# Patient Record
Sex: Female | Born: 1980 | Race: White | Hispanic: No | Marital: Single | Smoking: Former smoker
Health system: Southern US, Community
[De-identification: ages and names within clinical notes are randomized; demographics above are authoritative.]

## PROBLEM LIST (undated history)

## (undated) DIAGNOSIS — M1990 Unspecified osteoarthritis, unspecified site: Secondary | ICD-10-CM

## (undated) HISTORY — PX: LIPOSUCTION: SHX10

## (undated) HISTORY — PX: TONSILLECTOMY: SUR1361

## (undated) HISTORY — PX: RHINOPLASTY: SUR1284

## (undated) HISTORY — PX: BREAST SURGERY: SHX581

---

## 2014-02-01 NOTE — L&D Delivery Note (Signed)
Patient was C/C/+1 and pushed for 2hr 8 minutes with epidural.   NSVD female infant, Apgars 9/9, weight pending.   The patient had 2nd laceration repaired with 2-0vircyl and a periurethral tear repaired with 3-0 vicryl. Fundus was firm. EBL was expected amount. Placenta was delivered intact. Vagina was clear.  Baby was vigorous and doing skin to skin with mother.  Brittney Ramos

## 2014-04-01 LAB — OB RESULTS CONSOLE RPR: RPR: NONREACTIVE

## 2014-04-01 LAB — OB RESULTS CONSOLE GC/CHLAMYDIA
CHLAMYDIA, DNA PROBE: NEGATIVE
Gonorrhea: NEGATIVE

## 2014-04-01 LAB — OB RESULTS CONSOLE ABO/RH: RH Type: POSITIVE

## 2014-04-01 LAB — OB RESULTS CONSOLE RUBELLA ANTIBODY, IGM: Rubella: IMMUNE

## 2014-04-01 LAB — OB RESULTS CONSOLE HEPATITIS B SURFACE ANTIGEN: Hepatitis B Surface Ag: NEGATIVE

## 2014-04-01 LAB — OB RESULTS CONSOLE ANTIBODY SCREEN: Antibody Screen: NEGATIVE

## 2014-04-01 LAB — OB RESULTS CONSOLE HIV ANTIBODY (ROUTINE TESTING): HIV: NONREACTIVE

## 2014-04-08 ENCOUNTER — Other Ambulatory Visit (HOSPITAL_COMMUNITY): Payer: Self-pay | Admitting: Obstetrics

## 2014-04-08 DIAGNOSIS — O289 Unspecified abnormal findings on antenatal screening of mother: Secondary | ICD-10-CM

## 2014-05-03 ENCOUNTER — Encounter (HOSPITAL_COMMUNITY): Payer: Self-pay

## 2014-05-03 ENCOUNTER — Ambulatory Visit (HOSPITAL_COMMUNITY)
Admission: RE | Admit: 2014-05-03 | Discharge: 2014-05-03 | Disposition: A | Discharge status: Home / Self Care | Payer: Medicaid Other | Source: Ambulatory Visit | Attending: Obstetrics | Admitting: Obstetrics

## 2014-05-03 DIAGNOSIS — O289 Unspecified abnormal findings on antenatal screening of mother: Secondary | ICD-10-CM | POA: Insufficient documentation

## 2014-05-03 DIAGNOSIS — Z3689 Encounter for other specified antenatal screening: Secondary | ICD-10-CM | POA: Insufficient documentation

## 2014-05-03 DIAGNOSIS — O09899 Supervision of other high risk pregnancies, unspecified trimester: Secondary | ICD-10-CM

## 2014-05-03 DIAGNOSIS — Z3A19 19 weeks gestation of pregnancy: Secondary | ICD-10-CM | POA: Diagnosis not present

## 2014-05-03 DIAGNOSIS — Z36 Encounter for antenatal screening of mother: Secondary | ICD-10-CM | POA: Insufficient documentation

## 2014-05-03 DIAGNOSIS — O280 Abnormal hematological finding on antenatal screening of mother: Principal | ICD-10-CM

## 2014-05-03 DIAGNOSIS — O283 Abnormal ultrasonic finding on antenatal screening of mother: Secondary | ICD-10-CM | POA: Diagnosis not present

## 2014-05-03 HISTORY — DX: Unspecified osteoarthritis, unspecified site: M19.90

## 2014-05-03 IMAGING — US US OB DETAIL+14 WK
1 series · 12 of 28 positions shown · non-contrast
Comparison: none

[Series 1: us ob detail+14 wk · 0.20mm/px · 12 of 94 slices shown]
[im 4/94]
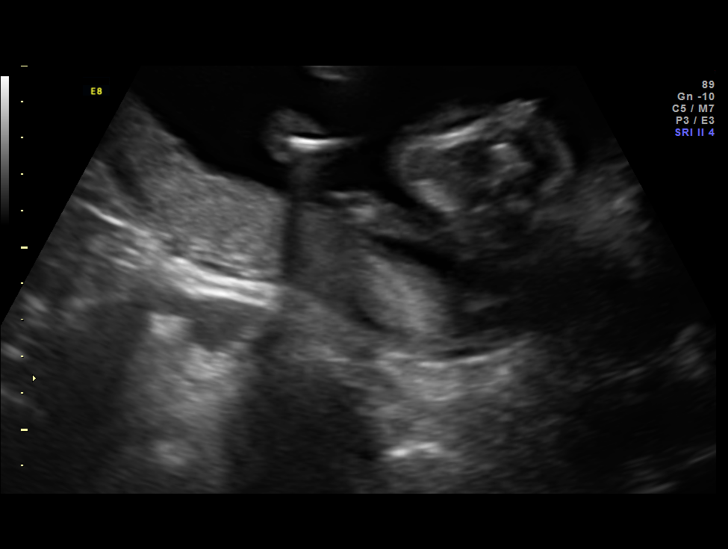
[im 11/94]
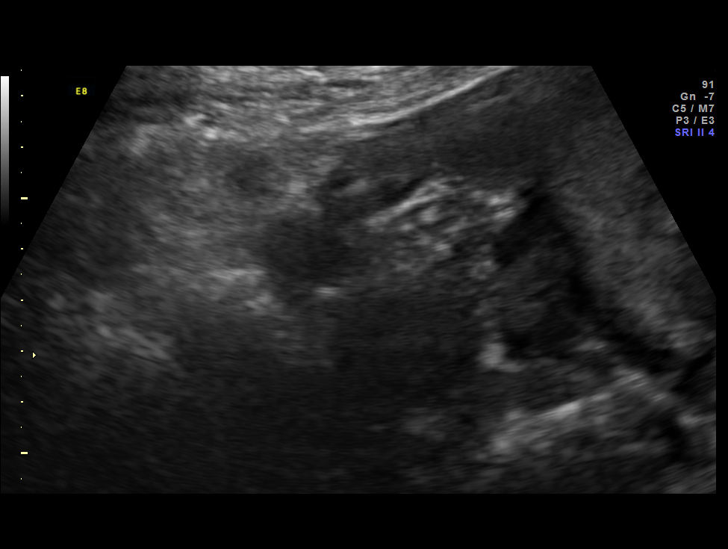
[im 18/94]
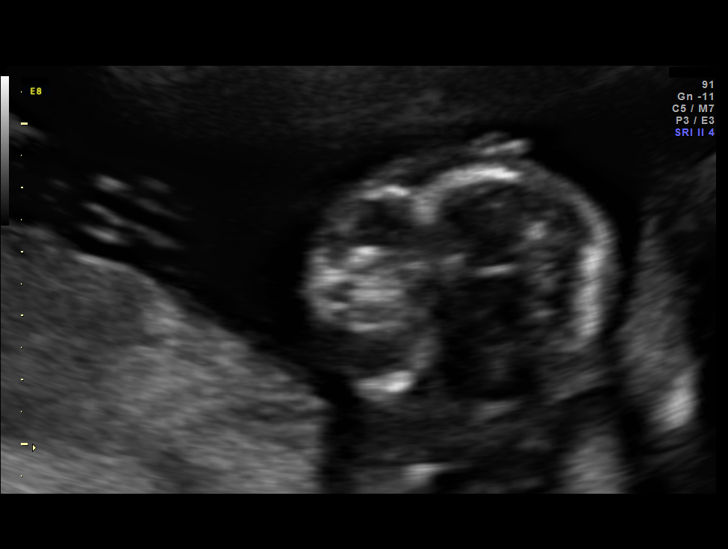
[im 28/94]
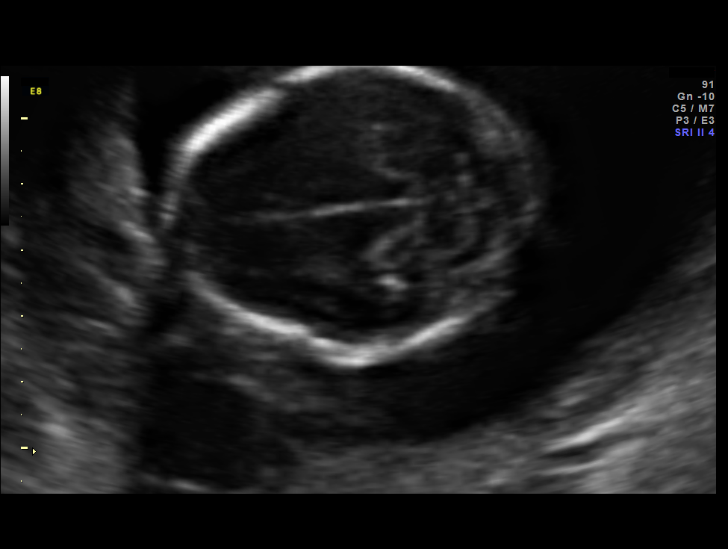
[im 35/94]
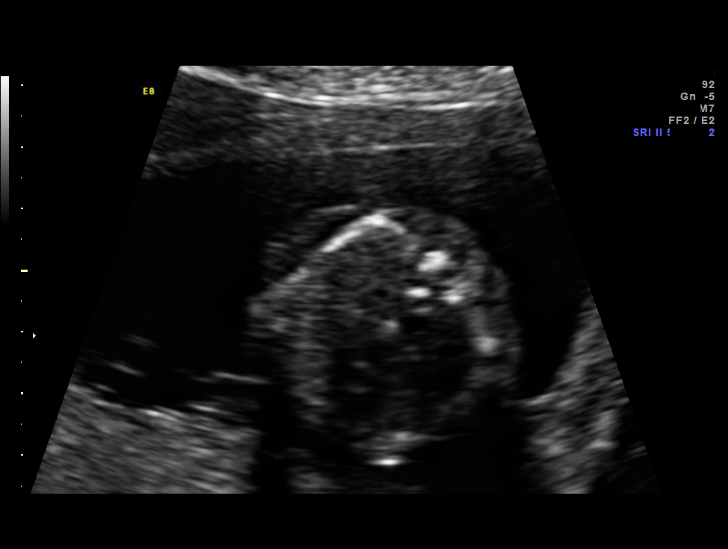
[im 42/94]
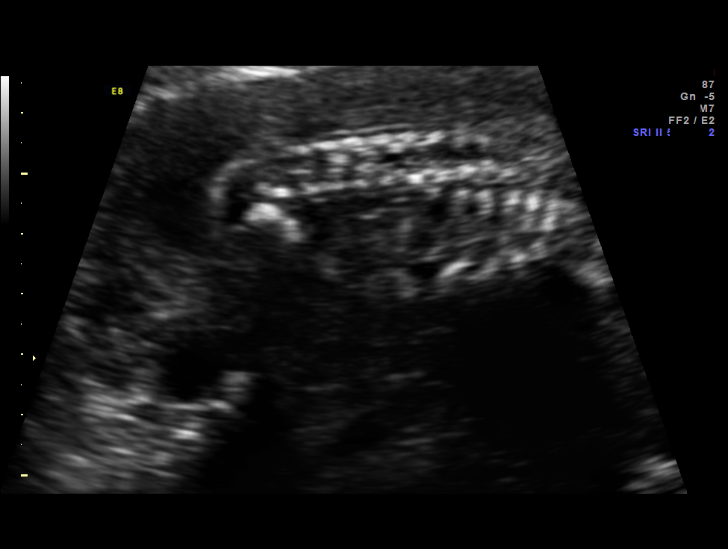
[im 52/94]
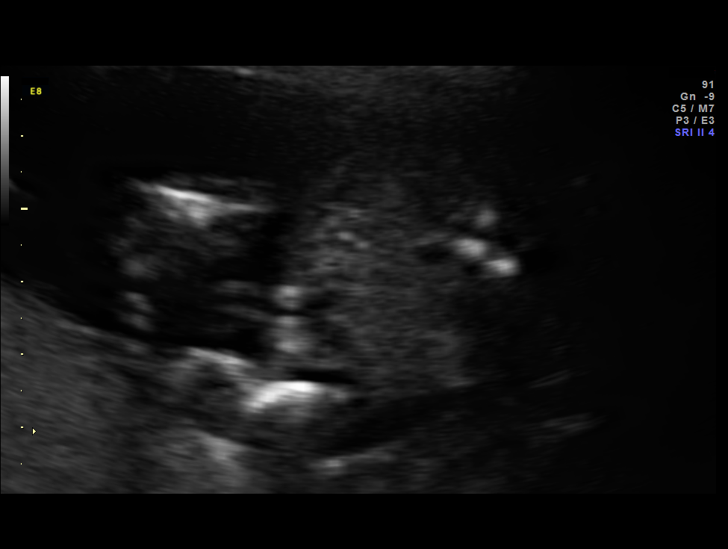
[im 59/94]
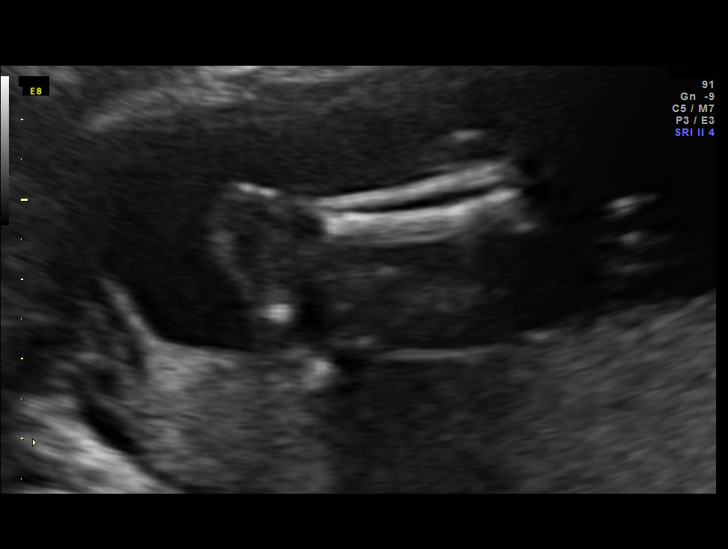
[im 66/94]
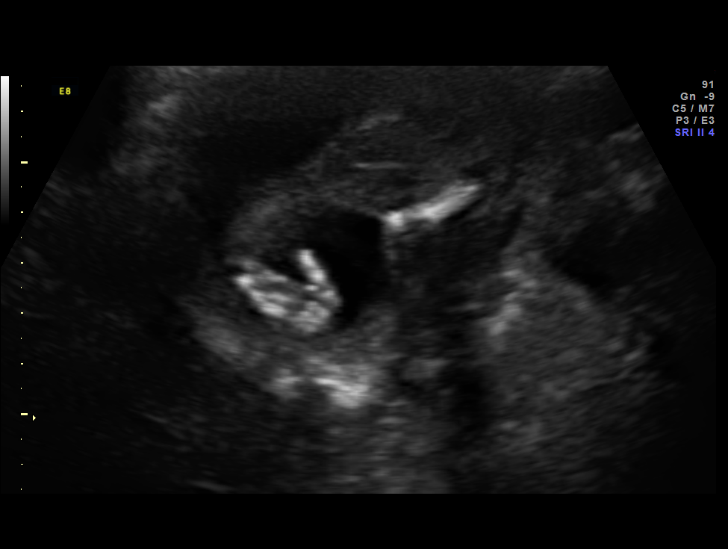
[im 76/94]
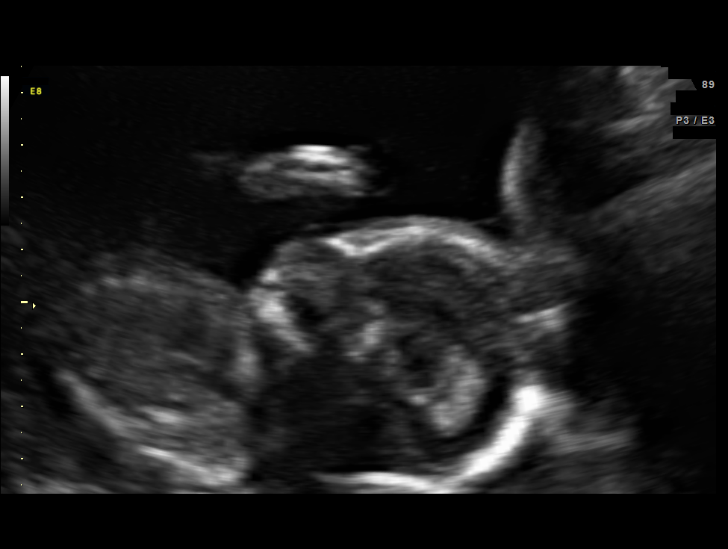
[im 83/94]
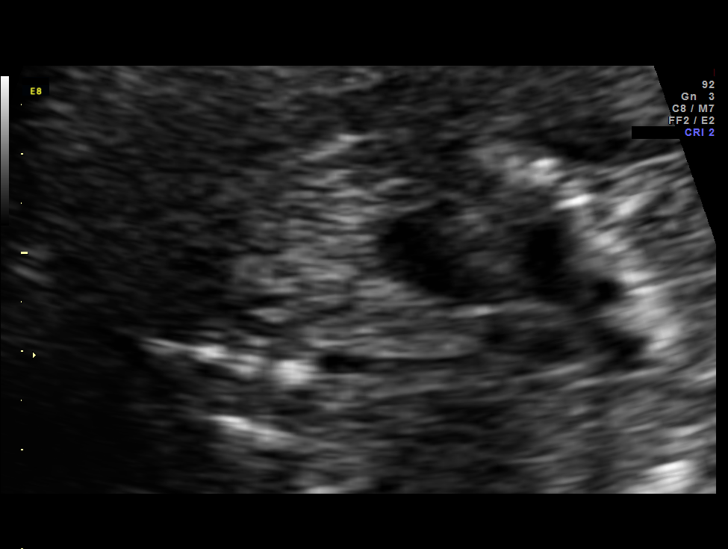
[im 90/94]
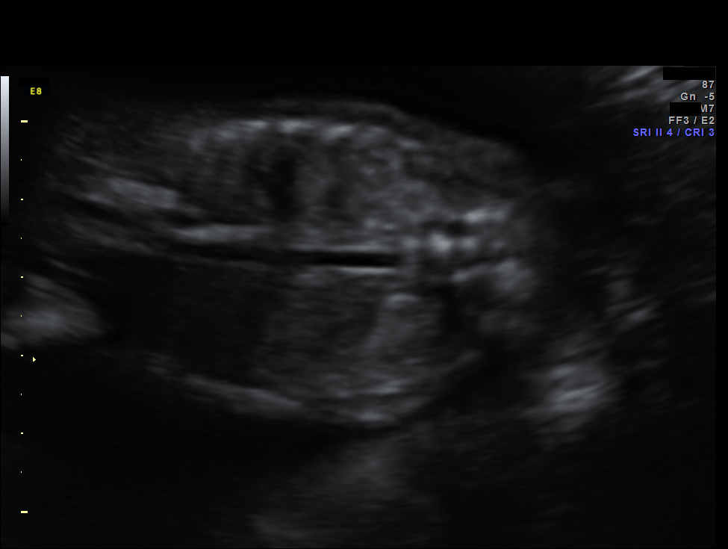

[12 of 28 positions shown; findings below may reference images not displayed]

OBSTETRICS REPORT
                      (Signed Final 05/03/2014 [DATE])

Service(s) Provided

 US OB DETAIL + 14 WK                                  76811.0
Indications

 Abnormal biochemical screen: Domingo Braswell
 19 weeks gestation of pregnancy
 Detailed fetal anatomic survey                        Z36
Fetal Evaluation

 Num Of Fetuses:    1
 Fetal Heart Rate:  144                          bpm
 Cardiac Activity:  Observed
 Presentation:      Cephalic
 Placenta:          Posterior, low-lying,
                    cm from int os
 P. Cord            Visualized
 Insertion:

 Amniotic Fluid
 AFI FV:      Subjectively within normal limits
                                             Larg Pckt:     5.2  cm
Biometry

 BPD:     42.8  mm     G. Age:  19w 0d                CI:         78.1   70 - 86
 OFD:     54.8  mm                                    FL/HC:      18.9   16.1 -

 HC:     157.3  mm     G. Age:  18w 5d       16  %    HC/AC:      1.10   1.09 -

 AC:     143.3  mm     G. Age:  19w 4d       58  %    FL/BPD:
 FL:      29.7  mm     G. Age:  19w 1d       39  %    FL/AC:      20.7   20 - 24
 HUM:     26.6  mm     G. Age:  18w 3d       28  %
 CER:     18.4  mm     G. Age:  18w 1d       19  %
 NFT:      4.4  mm
 Est. FW:     287  gm    0 lb 10 oz      47  %
Gestational Age

 U/S Today:     19w 1d                                        EDD:   09/26/14
 Best:          19w 2d     Det. By:  Early Ultrasound         EDD:   09/25/14
                                     (03/20/14)
Anatomy



 Other:  Fetus appears to be a female. Heels appears normal. LT 5th digit
         appears normal.
Targeted Anatomy

 Fetal Central Nervous System
 Cisterna Magna:
Cervix Uterus Adnexa

 Cervical Length:    3.6      cm

 Cervix:       Normal appearance by transabdominal scan. Appears
               closed, without funnelling.
 Left Ovary:    Within normal limits.
 Right Ovary:   Within normal limits.
Impression

 SIUP at 19+2 weeks
 Normal detailed fetal anatomy
 Markers of aneuploidy: none
 Normal amniotic fluid volume
 Measurements consistent with early US
 Posterior low-lying placenta
Recommendations

 Follow-up ultrasound for growth and placental location in the
 early third trimester
 Please see genetic counseling note

## 2014-05-06 ENCOUNTER — Other Ambulatory Visit (HOSPITAL_COMMUNITY): Payer: Self-pay | Admitting: Obstetrics

## 2014-05-06 ENCOUNTER — Encounter (HOSPITAL_COMMUNITY): Payer: Self-pay | Admitting: Obstetrics

## 2014-05-06 DIAGNOSIS — O09899 Supervision of other high risk pregnancies, unspecified trimester: Secondary | ICD-10-CM | POA: Insufficient documentation

## 2014-05-06 DIAGNOSIS — O280 Abnormal hematological finding on antenatal screening of mother: Principal | ICD-10-CM

## 2014-05-06 NOTE — Progress Notes (Signed)
Genetic Counseling  High-Risk Gestation Note  Appointment Date:  05/03/2014 Referred By: Marlow Baars, MD Date of Birth:  01-09-1981 Partner:  Brittney Ramos   Pregnancy History: G1P0 Estimated Date of Delivery: 09/25/14 Estimated Gestational Age: [redacted]w[redacted]d Attending: Particia Nearing, MD    [name] and her partner, Mr. Brittney Ramos, were seen for genetic counseling because of a low PAPP-A measurement on First Trimester Screening through [facility].   [name] had the first part of Sequential Screening (which is similar to the First trimester screen) performed through her OB office. We discussed that screening tests were designed to modify a patient's a priori risk for certain conditions, in this case, Down syndrome and trisomy 18. This estimate provides a pregnancy specific risk assessment. We reviewed that the results of Part 1 of the Sequential Screen for Brittney Ramos were considered within normal range for Down syndrome, reducing the risk from 1 in 355 to 1 in 440, and for Trisomy 18, increasing the risk from 1 in 1382 to 1 in 270 but still in the screen negative range. Brittney Ramos reported that part 2 of the sequential screen was drawn in early March. We discussed that this second blood draw further refines the risk for Down syndrome, Trisomy 18, and provides a risk assessment for open neural tube defects. These results were not available to [place] at the time of today's visit.   Even though the risk assessment provided by this screen was considered within normal range, the level of one of the proteins analyzed, PAPP-A, was very low (0.31 MoM). This has been associated with an increased risk for growth restriction or poor pregnancy outcome later in pregnancy; therefore, we would recommend a follow up ultrasound for fetal growth in the third trimester and monitoring the patient for signs of preeclampsia. We reviewed that available data do not indicate the specific risk for adverse pregnancy  outcomes when a low PAPP-A is detected in the first trimester. We reviewed that this level can also be low because of differences in maternal metabolism and normal variation.   A complete ultrasound was performed today. The ultrasound report will be sent under separate cover. There were no visualized fetal anomalies or markers suggestive of aneuploidy. They understand that screening tests cannot rule out all birth defects or genetic syndromes.   Brittney Ramos was provided with written information regarding cystic fibrosis (CF) including the carrier frequency and incidence in the Caucasian and Asian populations, the availability of carrier testing and prenatal diagnosis if indicated.  In addition, we discussed that CF is routinely screened for as part of the [facility] newborn screening panel.  She declined CF testing today.   Both family histories were reviewed and found to be contributory for a condition for the patient and her mother that causes them to develop "lumps and bumps" particularly in their glands. Brittney Ramos reported that her mother has had multiple surgeries to remove these benign growths, including a hysterectomy. 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 We reviewed genes and chromosomes. In autosomal dominant inheritance,  having one nonworking or changed copy in a particular gene pair causes the particular condition. Each offspring of an individual with an autosomal dominant condition has a 1 in 2 (50%) chance to also inherit the condition.  Males and females have  equal chances to inherit the condition. We discussed that often in autosomal dominant conditions, there can be variable expressivity of the features.  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 We discussed that in the case of an autosomal dominant condition, recurrence risk for the current pregnancy would be 1 in 2 (50%). Additional information regarding this history may alter recurrence risk assessment.  Without further information regarding the provided family history, an accurate genetic risk cannot be calculated. Further genetic counseling is warranted if more information is obtained.   Brittney Ramos denied exposure to environmental toxins or chemical agents. She denied the use of tobacco or street drugs. She denied significant viral illnesses during the course of her pregnancy. She reported drinking "a few" alcoholic drinks on January 1 and no alcohol other than that time during the pregnancy.  Prenatal alcohol exposure can increase the risk for growth delays, small head size, heart defects, eye and facial differences, as well as behavior problems and learning disabilities. The risk of these to occur tends to increase with the amount of alcohol consumed. However, because there is no identified safe amount of alcohol in pregnancy, it is recommended to completely avoid alcohol in pregnancy. Given the reported amount of exposure, risk for associated effects are likely low in the current pregnancy. Her medical and surgical histories were otherwise noncontributory from what was previously discussed during the family history discussion.   I counseled this couple for approximately 30 minutes regarding the above risks and available options.   Quinn PlowmanKaren Primrose Oler, MS,  Certified Genetic Counselor  05/06/2014

## 2014-08-30 LAB — OB RESULTS CONSOLE GBS: STREP GROUP B AG: NEGATIVE

## 2014-09-21 ENCOUNTER — Encounter (HOSPITAL_COMMUNITY): Payer: Self-pay

## 2014-09-21 ENCOUNTER — Inpatient Hospital Stay (HOSPITAL_COMMUNITY)
Admission: AD | Admit: 2014-09-21 | Discharge: 2014-09-21 | Disposition: A | Discharge status: Home / Self Care | Payer: Medicaid Other | Source: Ambulatory Visit | Attending: Obstetrics and Gynecology | Admitting: Obstetrics and Gynecology

## 2014-09-21 DIAGNOSIS — Z3A39 39 weeks gestation of pregnancy: Secondary | ICD-10-CM | POA: Diagnosis not present

## 2014-09-21 DIAGNOSIS — R51 Headache: Secondary | ICD-10-CM | POA: Insufficient documentation

## 2014-09-21 DIAGNOSIS — R110 Nausea: Secondary | ICD-10-CM | POA: Diagnosis not present

## 2014-09-21 DIAGNOSIS — Z87891 Personal history of nicotine dependence: Secondary | ICD-10-CM | POA: Diagnosis not present

## 2014-09-21 DIAGNOSIS — R519 Headache, unspecified: Secondary | ICD-10-CM

## 2014-09-21 DIAGNOSIS — O9989 Other specified diseases and conditions complicating pregnancy, childbirth and the puerperium: Secondary | ICD-10-CM | POA: Diagnosis not present

## 2014-09-21 DIAGNOSIS — O26893 Other specified pregnancy related conditions, third trimester: Secondary | ICD-10-CM

## 2014-09-21 LAB — URINALYSIS, ROUTINE W REFLEX MICROSCOPIC
BILIRUBIN URINE: NEGATIVE
Glucose, UA: NEGATIVE mg/dL
Hgb urine dipstick: NEGATIVE
Ketones, ur: NEGATIVE mg/dL
Nitrite: NEGATIVE
PH: 5.5 (ref 5.0–8.0)
Protein, ur: NEGATIVE mg/dL
UROBILINOGEN UA: 0.2 mg/dL (ref 0.0–1.0)

## 2014-09-21 LAB — CBC
HCT: 37.9 % (ref 36.0–46.0)
Hemoglobin: 12.6 g/dL (ref 12.0–15.0)
MCH: 27.9 pg (ref 26.0–34.0)
MCHC: 33.2 g/dL (ref 30.0–36.0)
MCV: 83.8 fL (ref 78.0–100.0)
Platelets: 297 10*3/uL (ref 150–400)
RBC: 4.52 MIL/uL (ref 3.87–5.11)
RDW: 14.5 % (ref 11.5–15.5)
WBC: 14.4 10*3/uL — ABNORMAL HIGH (ref 4.0–10.5)

## 2014-09-21 LAB — LACTATE DEHYDROGENASE: LDH: 100 U/L (ref 98–192)

## 2014-09-21 LAB — COMPREHENSIVE METABOLIC PANEL
ALT: 18 U/L (ref 14–54)
AST: 20 U/L (ref 15–41)
Albumin: 3 g/dL — ABNORMAL LOW (ref 3.5–5.0)
Alkaline Phosphatase: 106 U/L (ref 38–126)
Anion gap: 11 (ref 5–15)
BUN: 12 mg/dL (ref 6–20)
CO2: 21 mmol/L — ABNORMAL LOW (ref 22–32)
Calcium: 9.2 mg/dL (ref 8.9–10.3)
Chloride: 103 mmol/L (ref 101–111)
Creatinine, Ser: 0.44 mg/dL (ref 0.44–1.00)
GFR calc Af Amer: >60 mL/min (ref >60)
GFR calc non Af Amer: >60 mL/min (ref >60)
Glucose, Bld: 80 mg/dL (ref 65–99)
Potassium: 3.8 mmol/L (ref 3.5–5.1)
Sodium: 135 mmol/L (ref 135–145)
Total Bilirubin: 0.5 mg/dL (ref 0.3–1.2)
Total Protein: 6.8 g/dL (ref 6.5–8.1)

## 2014-09-21 LAB — PROTEIN / CREATININE RATIO, URINE
Creatinine, Urine: 12 mg/dL
Total Protein, Urine: <6 mg/dL

## 2014-09-21 LAB — URINE MICROSCOPIC-ADD ON

## 2014-09-21 LAB — URIC ACID: Uric Acid, Serum: 4.1 mg/dL (ref 2.3–6.6)

## 2014-09-21 NOTE — MAU Note (Signed)
Pt presents to MAU with complaints of nausea and a headache all day. Denies and vaginal bleeding, increase in vaginal discharge.

## 2014-09-21 NOTE — MAU Provider Note (Signed)
History     CSN: [id number]  Arrival date and time: 09/21/14 7829   First Provider Initiated Contact with Patient 09/21/14 1909      Chief Complaint  Patient presents with  . Headache   HPI  Brittney Ramos is a 34 y.o. G1P0 at [redacted]w[redacted]d. She presents with headache and nausea all day. She has taken tylenol x 3 doses, no help. She is well hydrated, was not out in the hot weather today. She does have some blurry vision, no hx headaches or migraines. The baby hasn't moved much this week, but has been active today. Is unsure if contracting, her abd has felt tight all day with low pressure/cramping. No bleeding or leaking.  B/P in PNR are 102-120/60-70. Today are 139/79, 139/81.  OB History    Gravida Para Term Preterm AB TAB SAB Ectopic Multiple Living   1               Past Medical History  Diagnosis Date  . Arthritis     Past Surgical History  Procedure Laterality Date  . Breast surgery      History reviewed. No pertinent family history.  Social History  Substance Use Topics  . Smoking status: Former Games developer  . Smokeless tobacco: None  . Alcohol Use: No    Allergies:  Allergies  Allergen Reactions  . Other Anaphylaxis    Cantelope, diet mountain dew, diet lipton tea  . Ibuprofen Nausea Only    Prescriptions prior to admission  Medication Sig Dispense Refill Last Dose  . Prenatal Vit-Fe Fumarate-FA (PRENATAL MULTIVITAMIN) TABS tablet Take 1 tablet by mouth daily at 12 noon.   Taking    Review of Systems  Constitutional: Positive for malaise/fatigue. Negative for fever and chills.  Gastrointestinal: Positive for nausea. Negative for vomiting, abdominal pain, diarrhea and constipation.  Genitourinary: Negative for dysuria, urgency and frequency.  Neurological: Negative for dizziness and focal weakness.   Physical Exam   Blood pressure 139/81, pulse 89, temperature 98.3 F (36.8 C), resp. rate 18, height 5\' 8"  (1.727 m), weight 117.935 kg (260 lb).  Physical  Exam  Nursing note and vitals reviewed. Constitutional: She is oriented to person, place, and time. She appears well-developed and well-nourished.  Musculoskeletal: Normal range of motion. She exhibits no edema.  Neurological: She is alert and oriented to person, place, and time. She has normal reflexes.  Skin: Skin is warm and dry.  Psychiatric: She has a normal mood and affect. Her behavior is normal.    MAU Course  Procedures  MDM Results for orders placed or performed during the hospital encounter of 09/21/14 (from the past 24 hour(s))  Urinalysis, Routine w reflex microscopic (not at [facility])     Status: Abnormal   Collection Time: 09/21/14  6:20 PM  Result Value Ref Range   Color, Urine YELLOW YELLOW   APPearance CLEAR CLEAR   Specific Gravity, Urine <1.005 (L) 1.005 - 1.030   pH 5.5 5.0 - 8.0   Glucose, UA NEGATIVE NEGATIVE mg/dL   Hgb urine dipstick NEGATIVE NEGATIVE   Bilirubin Urine NEGATIVE NEGATIVE   Ketones, ur NEGATIVE NEGATIVE mg/dL   Protein, ur NEGATIVE NEGATIVE mg/dL   Urobilinogen, UA 0.2 0.0 - 1.0 mg/dL   Nitrite NEGATIVE NEGATIVE   Leukocytes, UA SMALL (A) NEGATIVE  Urine microscopic-add on     Status: Abnormal   Collection Time: 09/21/14  6:20 PM  Result Value Ref Range   Squamous Epithelial / LPF FEW (A) RARE  WBC, UA 0-2 <3 WBC/hpf   RBC / HPF 0-2 <3 RBC/hpf   Bacteria, UA FEW (A) RARE  Protein / creatinine ratio, urine     Status: None   Collection Time: 09/21/14  6:20 PM  Result Value Ref Range   Creatinine, Urine 12.00 mg/dL   Total Protein, Urine <6 mg/dL   Protein Creatinine Ratio        0.00 - 0.15 mg/mg[Cre]  CBC     Status: Abnormal   Collection Time: 09/21/14  7:40 PM  Result Value Ref Range   WBC 14.4 (H) 4.0 - 10.5 K/uL   RBC 4.52 3.87 - 5.11 MIL/uL   Hemoglobin 12.6 12.0 - 15.0 g/dL   HCT 52.8 41.3 - 24.4 %   MCV 83.8 78.0 - 100.0 fL   MCH 27.9 26.0 - 34.0 pg   MCHC 33.2 30.0 - 36.0 g/dL   RDW 01.0 27.2 - 53.6 %   Platelets 297  150 - 400 K/uL  Comprehensive metabolic panel     Status: Abnormal   Collection Time: 09/21/14  7:40 PM  Result Value Ref Range   Sodium 135 135 - 145 mmol/L   Potassium 3.8 3.5 - 5.1 mmol/L   Chloride 103 101 - 111 mmol/L   CO2 21 (L) 22 - 32 mmol/L   Glucose, Bld 80 65 - 99 mg/dL   BUN 12 6 - 20 mg/dL   Creatinine, Ser 6.44 0.44 - 1.00 mg/dL   Calcium 9.2 8.9 - 03.4 mg/dL   Total Protein 6.8 6.5 - 8.1 g/dL   Albumin 3.0 (L) 3.5 - 5.0 g/dL   AST 20 15 - 41 U/L   ALT 18 14 - 54 U/L   Alkaline Phosphatase 106 38 - 126 U/L   Total Bilirubin 0.5 0.3 - 1.2 mg/dL   GFR calc non Af Amer >60 >60 mL/min   GFR calc Af Amer >60 >60 mL/min   Anion gap 11 5 - 15  Uric acid     Status: None   Collection Time: 09/21/14  7:40 PM  Result Value Ref Range   Uric Acid, Serum 4.1 2.3 - 6.6 mg/dL  Lactate dehydrogenase     Status: None   Collection Time: 09/21/14  7:40 PM  Result Value Ref Range   LDH 100 98 - 192 U/L      Assessment and Plan  39 3/7 wks with headaches, nausea PIH labs neg, B/P sl elevated for her but still within normal range Reactive strip Consulted with Dr Tenny Craw, pt may be discharged home, has an appt 8/22 in theoffice PIH precautions reviewed Labor precautions reviewed Pt declines tx for ha at this time, try warm bath, massage, cool compresses  Cayetano Mikita M. 09/21/2014, 7:19 PM

## 2014-09-23 ENCOUNTER — Institutional Professional Consult (permissible substitution): Payer: Self-pay | Admitting: Pediatrics

## 2014-09-25 ENCOUNTER — Telehealth (HOSPITAL_COMMUNITY): Payer: Self-pay | Admitting: *Deleted

## 2014-09-25 ENCOUNTER — Encounter (HOSPITAL_COMMUNITY): Payer: Self-pay | Admitting: *Deleted

## 2014-09-25 NOTE — Telephone Encounter (Signed)
Preadmission screen  

## 2014-10-02 ENCOUNTER — Inpatient Hospital Stay (HOSPITAL_COMMUNITY)
Admission: RE | Admit: 2014-10-02 | Discharge: 2014-10-05 | DRG: 775 | Disposition: A | Discharge status: Home / Self Care | Payer: Medicaid Other | Source: Ambulatory Visit | Attending: Obstetrics and Gynecology | Admitting: Obstetrics and Gynecology

## 2014-10-02 ENCOUNTER — Inpatient Hospital Stay (HOSPITAL_COMMUNITY): Payer: Medicaid Other | Admitting: Anesthesiology

## 2014-10-02 ENCOUNTER — Encounter (HOSPITAL_COMMUNITY): Payer: Self-pay | Admitting: General Practice

## 2014-10-02 DIAGNOSIS — O9962 Diseases of the digestive system complicating childbirth: Secondary | ICD-10-CM | POA: Diagnosis present

## 2014-10-02 DIAGNOSIS — Z6839 Body mass index (BMI) 39.0-39.9, adult: Secondary | ICD-10-CM

## 2014-10-02 DIAGNOSIS — Z3490 Encounter for supervision of normal pregnancy, unspecified, unspecified trimester: Secondary | ICD-10-CM

## 2014-10-02 DIAGNOSIS — O99214 Obesity complicating childbirth: Secondary | ICD-10-CM | POA: Diagnosis present

## 2014-10-02 DIAGNOSIS — Z3A41 41 weeks gestation of pregnancy: Secondary | ICD-10-CM | POA: Diagnosis present

## 2014-10-02 DIAGNOSIS — K219 Gastro-esophageal reflux disease without esophagitis: Secondary | ICD-10-CM | POA: Diagnosis present

## 2014-10-02 DIAGNOSIS — Z87891 Personal history of nicotine dependence: Secondary | ICD-10-CM | POA: Diagnosis not present

## 2014-10-02 DIAGNOSIS — O480 Post-term pregnancy: Secondary | ICD-10-CM | POA: Diagnosis present

## 2014-10-02 LAB — CBC
HCT: 35.4 % — ABNORMAL LOW (ref 36.0–46.0)
Hemoglobin: 11.9 g/dL — ABNORMAL LOW (ref 12.0–15.0)
MCH: 28 pg (ref 26.0–34.0)
MCHC: 33.6 g/dL (ref 30.0–36.0)
MCV: 83.3 fL (ref 78.0–100.0)
PLATELETS: 283 10*3/uL (ref 150–400)
RBC: 4.25 MIL/uL (ref 3.87–5.11)
RDW: 14 % (ref 11.5–15.5)
WBC: 15 10*3/uL — ABNORMAL HIGH (ref 4.0–10.5)

## 2014-10-02 LAB — RPR: RPR: NONREACTIVE

## 2014-10-02 LAB — TYPE AND SCREEN
ABO/RH(D): O POS
Antibody Screen: NEGATIVE

## 2014-10-02 LAB — ABO/RH: ABO/RH(D): O POS

## 2014-10-02 MED ORDER — MISOPROSTOL 25 MCG QUARTER TABLET
25.0000 ug | ORAL_TABLET | ORAL | Status: DC | PRN
  Administered 2014-10-02: 25 ug via VAGINAL
  Filled 2014-10-02: qty 0.25

## 2014-10-02 MED ORDER — CITRIC ACID-SODIUM CITRATE 334-500 MG/5ML PO SOLN: 30.0000 mL | ORAL | Status: DC | PRN

## 2014-10-02 MED ORDER — EPHEDRINE 5 MG/ML INJ: 10.0000 mg | INTRAVENOUS | Status: DC | PRN

## 2014-10-02 MED ORDER — OXYTOCIN 40 UNITS IN LACTATED RINGERS INFUSION - SIMPLE MED: 62.5000 mL/h | INTRAVENOUS | Status: DC

## 2014-10-02 MED ORDER — LIDOCAINE HCL (PF) 1 % IJ SOLN
30.0000 mL | INTRAMUSCULAR | Status: DC | PRN
  Administered 2014-10-03: 30 mL via SUBCUTANEOUS
  Filled 2014-10-02: qty 30

## 2014-10-02 MED ORDER — ONDANSETRON HCL 4 MG/2ML IJ SOLN: 4.0000 mg | Freq: Four times a day (QID) | INTRAMUSCULAR | Status: DC | PRN

## 2014-10-02 MED ORDER — LACTATED RINGERS IV SOLN: 500.0000 mL | INTRAVENOUS | Status: DC | PRN

## 2014-10-02 MED ORDER — LACTATED RINGERS IV SOLN
INTRAVENOUS | Status: DC
  Administered 2014-10-02 (×2): via INTRAVENOUS

## 2014-10-02 MED ORDER — FENTANYL 2.5 MCG/ML BUPIVACAINE 1/10 % EPIDURAL INFUSION (WH - ANES): 14.0000 mL/h | INTRAMUSCULAR | Status: DC | PRN

## 2014-10-02 MED ORDER — LIDOCAINE HCL (PF) 1 % IJ SOLN
INTRAMUSCULAR | Status: DC | PRN
  Administered 2014-10-02: 5 mL via EPIDURAL
  Administered 2014-10-02: 4 mL via EPIDURAL

## 2014-10-02 MED ORDER — OXYCODONE-ACETAMINOPHEN 5-325 MG PO TABS: 2.0000 | ORAL_TABLET | ORAL | Status: DC | PRN

## 2014-10-02 MED ORDER — OXYTOCIN BOLUS FROM INFUSION
500.0000 mL | INTRAVENOUS | Status: DC
  Administered 2014-10-03: 500 mL via INTRAVENOUS

## 2014-10-02 MED ORDER — OXYCODONE-ACETAMINOPHEN 5-325 MG PO TABS: 1.0000 | ORAL_TABLET | ORAL | Status: DC | PRN

## 2014-10-02 MED ORDER — OXYTOCIN 40 UNITS IN LACTATED RINGERS INFUSION - SIMPLE MED: 1.0000 m[IU]/min | INTRAVENOUS | Status: DC

## 2014-10-02 MED ORDER — PHENYLEPHRINE 40 MCG/ML (10ML) SYRINGE FOR IV PUSH (FOR BLOOD PRESSURE SUPPORT): 80.0000 ug | PREFILLED_SYRINGE | INTRAVENOUS | Status: DC | PRN

## 2014-10-02 MED ORDER — DIPHENHYDRAMINE HCL 50 MG/ML IJ SOLN: 12.5000 mg | INTRAMUSCULAR | Status: DC | PRN

## 2014-10-02 MED ORDER — ACETAMINOPHEN 325 MG PO TABS: 650.0000 mg | ORAL_TABLET | ORAL | Status: DC | PRN

## 2014-10-02 MED ORDER — PHENYLEPHRINE 40 MCG/ML (10ML) SYRINGE FOR IV PUSH (FOR BLOOD PRESSURE SUPPORT)
80.0000 ug | PREFILLED_SYRINGE | INTRAVENOUS | Status: DC | PRN
  Filled 2014-10-02: qty 20

## 2014-10-02 MED ORDER — FENTANYL 2.5 MCG/ML BUPIVACAINE 1/10 % EPIDURAL INFUSION (WH - ANES)
14.0000 mL/h | INTRAMUSCULAR | Status: DC | PRN
  Administered 2014-10-02: 15 mL/h via EPIDURAL
  Administered 2014-10-02 – 2014-10-03 (×2): 14 mL/h via EPIDURAL
  Filled 2014-10-02 (×2): qty 125

## 2014-10-02 MED ORDER — TERBUTALINE SULFATE 1 MG/ML IJ SOLN: 0.2500 mg | Freq: Once | INTRAMUSCULAR | Status: DC | PRN

## 2014-10-02 MED ORDER — ZOLPIDEM TARTRATE 5 MG PO TABS: 5.0000 mg | ORAL_TABLET | Freq: Every evening | ORAL | Status: DC | PRN

## 2014-10-02 MED ORDER — OXYTOCIN 40 UNITS IN LACTATED RINGERS INFUSION - SIMPLE MED
1.0000 m[IU]/min | INTRAVENOUS | Status: DC
  Administered 2014-10-02: 1 m[IU]/min via INTRAVENOUS
  Filled 2014-10-02: qty 1000

## 2014-10-02 NOTE — Anesthesia Procedure Notes (Signed)
Epidural Patient location during procedure: OB Start time: 10/02/2014 4:38 PM  Staffing Anesthesiologist: Mal Amabile Performed by: anesthesiologist   Preanesthetic Checklist Completed: patient identified, site marked, surgical consent, pre-op evaluation, timeout performed, IV checked, risks and benefits discussed and monitors and equipment checked  Epidural Patient position: sitting Prep: site prepped and draped and DuraPrep Patient monitoring: continuous pulse ox and blood pressure Approach: midline Location: L3-L4 Injection technique: LOR air  Needle:  Needle type: Tuohy  Needle gauge: 17 G Needle length: 9 cm and 9 Needle insertion depth: 8 cm Catheter type: closed end flexible Catheter size: 19 Gauge Catheter at skin depth: 12 cm Test dose: negative and Other  Assessment Events: blood not aspirated, injection not painful, no injection resistance, negative IV test and no paresthesia  Additional Notes Patient identified. Risks and benefits discussed including failed block, incomplete  Pain control, post dural puncture headache, nerve damage, paralysis, blood pressure Changes, nausea, vomiting, reactions to medications-both toxic and allergic and post Partum back pain. All questions were answered. Patient expressed understanding and wished to proceed. Sterile technique was used throughout procedure. Epidural site was Dressed with sterile barrier dressing. No paresthesias, signs of intravascular injection Or signs of intrathecal spread were encountered.  Patient was more comfortable after the epidural was dosed. Please see RN's note for documentation of vital signs and FHR which are stable.

## 2014-10-02 NOTE — Anesthesia Preprocedure Evaluation (Signed)
Anesthesia Evaluation  Patient identified by MRN, date of birth, ID band Patient awake    Reviewed: Allergy & Precautions, Patient's Chart, lab work & pertinent test results  Airway Mallampati: III  TM Distance: >3 FB Neck ROM: Full    Dental  (+) Teeth Intact   Pulmonary former smoker,  breath sounds clear to auscultation  Pulmonary exam normal       Cardiovascular negative cardio ROS Normal cardiovascular examRhythm:Regular Rate:Normal     Neuro/Psych negative neurological ROS  negative psych ROS   GI/Hepatic Neg liver ROS, GERD-  ,  Endo/Other  Morbid obesity  Renal/GU negative Renal ROS  negative genitourinary   Musculoskeletal  (+) Arthritis -,   Abdominal (+) + obese,   Peds  Hematology  (+) anemia ,   Anesthesia Other Findings   Reproductive/Obstetrics (+) Pregnancy                             Anesthesia Physical Anesthesia Plan  ASA: III  Anesthesia Plan: Epidural   Post-op Pain Management:    Induction:   Airway Management Planned: Natural Airway  Additional Equipment:   Intra-op Plan:   Post-operative Plan:   Informed Consent: I have reviewed the patients History and Physical, chart, labs and discussed the procedure including the risks, benefits and alternatives for the proposed anesthesia with the patient or authorized representative who has indicated his/her understanding and acceptance.     Plan Discussed with: Anesthesiologist  Anesthesia Plan Comments:         Anesthesia Quick Evaluation

## 2014-10-02 NOTE — Plan of Care (Signed)
Problem: Consults Goal: Birthing Suites Patient Information Press F2 to bring up selections list   Pt 37-[redacted] weeks EGA     

## 2014-10-02 NOTE — H&P (Signed)
34 y.o. [redacted]w[redacted]d  G1P0 comes in for scheduled IOL.  Otherwise has good fetal movement and no bleeding.  Past Medical History  Diagnosis Date  . Arthritis     Past Surgical History  Procedure Laterality Date  . Breast surgery    . Rhinoplasty    . Liposuction      OB History  Gravida Para Term Preterm AB SAB TAB Ectopic Multiple Living  1             # Outcome Date GA Lbr Len/2nd Weight Sex Delivery Anes PTL Lv  1 Current               Social History   Social History  . Marital Status: Single    Spouse Name: N/A  . Number of Children: N/A  . Years of Education: N/A   Occupational History  . Not on file.   Social History Main Topics  . Smoking status: Former Smoker    Quit date: 09/25/2011  . Smokeless tobacco: Never Used  . Alcohol Use: No  . Drug Use: No  . Sexual Activity: Not on file   Other Topics Concern  . Not on file   Social History Narrative   Grapefruit concentrate; Other; Ibuprofen; Robitussin (alcohol free); and Latex    Prenatal Transfer Tool  Maternal Diabetes: No Genetic Screening: Abnormal:  Results: Other: low PAPP-A Maternal Ultrasounds/Referrals: Normal Fetal Ultrasounds or other Referrals:  Referred to Materal Fetal Medicine  Maternal Substance Abuse:  No Significant Maternal Medications:  None Significant Maternal Lab Results: Lab values include: Group B Strep negative  Other PNC: uncomplicated.    Filed Vitals:   10/02/14 2224  BP:   Pulse:   Temp: 98.5 F (36.9 C)  Resp:      Lungs/Cor:  NAD Abdomen:  soft, gravid Ex:  no cords, erythema SVE:  1/th/-3 at admission FHTs:  130, good STV, NST R Toco:  q2-4   A/P   Admitted for IOL Received cytotec and ruptured shortly thereafter Switched to pitocin with subsequent cervical changed noted Now with epidural  GBS Neg  Joshaua Epple

## 2014-10-02 NOTE — Progress Notes (Signed)
Pt feeling more contractions. Pitocin  FHT Cat 1 TOCO: irreg SVE 3/70/-1 Increase pitocin 2x2 Pt may have epidural EFW 7-8# by leopolds

## 2014-10-03 ENCOUNTER — Encounter (HOSPITAL_COMMUNITY): Payer: Self-pay | Admitting: *Deleted

## 2014-10-03 MED ORDER — OXYCODONE-ACETAMINOPHEN 5-325 MG PO TABS
2.0000 | ORAL_TABLET | ORAL | Status: DC | PRN
  Administered 2014-10-04 – 2014-10-05 (×4): 2 via ORAL
  Filled 2014-10-03 (×4): qty 2

## 2014-10-03 MED ORDER — IBUPROFEN 600 MG PO TABS
600.0000 mg | ORAL_TABLET | Freq: Four times a day (QID) | ORAL | Status: DC
  Filled 2014-10-03: qty 1

## 2014-10-03 MED ORDER — ONDANSETRON HCL 4 MG/2ML IJ SOLN: 4.0000 mg | INTRAMUSCULAR | Status: DC | PRN

## 2014-10-03 MED ORDER — DIBUCAINE 1 % RE OINT
1.0000 "application " | TOPICAL_OINTMENT | RECTAL | Status: DC | PRN
  Administered 2014-10-04: 1 via RECTAL
  Filled 2014-10-03 (×2): qty 28

## 2014-10-03 MED ORDER — SENNOSIDES-DOCUSATE SODIUM 8.6-50 MG PO TABS
2.0000 | ORAL_TABLET | ORAL | Status: DC
  Administered 2014-10-04 – 2014-10-05 (×2): 2 via ORAL
  Filled 2014-10-03 (×2): qty 2

## 2014-10-03 MED ORDER — ONDANSETRON HCL 4 MG PO TABS: 4.0000 mg | ORAL_TABLET | ORAL | Status: DC | PRN

## 2014-10-03 MED ORDER — BENZOCAINE-MENTHOL 20-0.5 % EX AERO
1.0000 "application " | INHALATION_SPRAY | CUTANEOUS | Status: DC | PRN
  Administered 2014-10-03 – 2014-10-04 (×2): 1 via TOPICAL
  Filled 2014-10-03 (×3): qty 56

## 2014-10-03 MED ORDER — OXYCODONE-ACETAMINOPHEN 5-325 MG PO TABS
1.0000 | ORAL_TABLET | ORAL | Status: DC | PRN
  Administered 2014-10-03 (×2): 1 via ORAL
  Filled 2014-10-03 (×2): qty 1

## 2014-10-03 MED ORDER — PRENATAL MULTIVITAMIN CH
1.0000 | ORAL_TABLET | Freq: Every day | ORAL | Status: DC
  Administered 2014-10-03 – 2014-10-04 (×2): 1 via ORAL
  Filled 2014-10-03 (×3): qty 1

## 2014-10-03 MED ORDER — SIMETHICONE 80 MG PO CHEW: 80.0000 mg | CHEWABLE_TABLET | ORAL | Status: DC | PRN

## 2014-10-03 MED ORDER — LANOLIN HYDROUS EX OINT: TOPICAL_OINTMENT | CUTANEOUS | Status: DC | PRN

## 2014-10-03 MED ORDER — WITCH HAZEL-GLYCERIN EX PADS
1.0000 "application " | MEDICATED_PAD | CUTANEOUS | Status: DC | PRN
  Administered 2014-10-04: 1 via TOPICAL

## 2014-10-03 MED ORDER — TETANUS-DIPHTH-ACELL PERTUSSIS 5-2.5-18.5 LF-MCG/0.5 IM SUSP: 0.5000 mL | Freq: Once | INTRAMUSCULAR | Status: DC

## 2014-10-03 MED ORDER — ZOLPIDEM TARTRATE 5 MG PO TABS: 5.0000 mg | ORAL_TABLET | Freq: Every evening | ORAL | Status: DC | PRN

## 2014-10-03 MED ORDER — DIPHENHYDRAMINE HCL 25 MG PO CAPS: 25.0000 mg | ORAL_CAPSULE | Freq: Four times a day (QID) | ORAL | Status: DC | PRN

## 2014-10-03 MED ORDER — ACETAMINOPHEN 325 MG PO TABS
650.0000 mg | ORAL_TABLET | ORAL | Status: DC | PRN
  Administered 2014-10-03 (×2): 650 mg via ORAL
  Filled 2014-10-03 (×2): qty 2

## 2014-10-03 NOTE — Lactation Note (Signed)
This note was copied from the chart of Brittney Ramos. Lactation Consultation Note Initial visit at 11 hours of age.  Mom reports having breast augmentation in 2010.  Mom reports nipple  Was removed with 1200 cc implants under muscle.  She reports large soft saggy breast before implants and lift was done also.  Mom does report sensation on breasts, but on nipple color change noted during pregnancy.  Mom has large scaring noted at 6 o'clock position that extends 1 1/2 ".  Mom is aware she may not make enough milk for baby and plans to give colostrum and then supplement with formula as needed.  Hand expression demonstrated with several drops noted from left breast, non noted on right side.  Hand pump given with instructions for stimulation.  Encouraged mom to feed baby on demand and we will monitor for output, frequency and weight loss to determine plan.   Discussed options to pump to increase stimulation for milk production, mom not eager at this time, but knows to requests if she wants to pump here.   MOm does reports some pain with latch, but describes a good deep latch.  Mom to call with next feeding attempt for [facility] score.  [facility] LC resources given and discussed.  Encouraged to feed with early cues on demand.  Early newborn behavior discussed. Mom to call for assist as needed.    Patient Name: Brittney Ramos [id number] Date: 10/03/2014 Reason for consult: Initial assessment   Maternal Data Has patient been taught Hand Expression?: Yes Does the patient have breastfeeding experience prior to this delivery?: No  Feeding Feeding Type: Breast Fed Length of feed: 15 min  LATCH Score/Interventions Latch: Grasps breast easily, tongue down, lips flanged, rhythmical sucking.  Audible Swallowing: A few with stimulation Intervention(s): Skin to skin;Hand expression Intervention(s): Skin to skin;Hand expression  Type of Nipple: Everted at rest and after stimulation  Comfort (Breast/Nipple): Soft  / non-tender     Hold (Positioning): Assistance needed to correctly position infant at breast and maintain latch. Intervention(s): Breastfeeding basics reviewed  LATCH Score: 8  Lactation Tools Discussed/Used Pump Review: Setup, frequency, and cleaning Initiated by:: JS Date initiated:: 10/03/14   Consult Status Consult Status: Follow-up Date: 10/04/14 Follow-up type: In-patient    Jannifer Rodney 10/03/2014, 5:24 PM

## 2014-10-03 NOTE — Lactation Note (Signed)
This note was copied from the chart of Brittney Zoiee Wimmer. Lactation Consultation Note Follow up visit at 16 hours of age.  Mom reports wanting to feed baby again with assist, but baby is asleep.  FOB placed baby STS with mom in football hold and baby more awake.  Assisted with latch. Baby latches well with wide flanged lips and strong sucking bursts.  Audible swallows noted.  Discussed supply and demand and risk for engorgement and mastitis with mom having breast surgery.  Mom is unsure about pumping and has hand pump to help evert nipples with latching.  MBU RN aware of plan and will encourage supplement as needed.     Patient Name: Brittney Ramos [id number] Date: 10/03/2014 Reason for consult: Follow-up assessment   Maternal Data    Feeding Feeding Type: Breast Fed Length of feed: 5 min  LATCH Score/Interventions Latch: Repeated attempts needed to sustain latch, nipple held in mouth throughout feeding, stimulation needed to elicit sucking reflex. Intervention(s): Adjust position;Assist with latch;Breast massage;Breast compression  Audible Swallowing: A few with stimulation Intervention(s): Hand expression;Skin to skin  Type of Nipple: Everted at rest and after stimulation  Comfort (Breast/Nipple): Soft / non-tender     Hold (Positioning): Assistance needed to correctly position infant at breast and maintain latch. Intervention(s): Breastfeeding basics reviewed;Support Pillows;Position options;Skin to skin  LATCH Score: 7  Lactation Tools Discussed/Used     Consult Status Consult Status: Follow-up Date: 10/04/14 Follow-up type: In-patient    Shoptaw, Arvella Merles 10/03/2014, 10:33 PM

## 2014-10-04 LAB — CBC
HCT: 30.8 % — ABNORMAL LOW (ref 36.0–46.0)
Hemoglobin: 10.1 g/dL — ABNORMAL LOW (ref 12.0–15.0)
MCH: 28 pg (ref 26.0–34.0)
MCHC: 32.8 g/dL (ref 30.0–36.0)
MCV: 85.3 fL (ref 78.0–100.0)
PLATELETS: 232 10*3/uL (ref 150–400)
RBC: 3.61 MIL/uL — ABNORMAL LOW (ref 3.87–5.11)
RDW: 14.6 % (ref 11.5–15.5)
WBC: 15.5 10*3/uL — ABNORMAL HIGH (ref 4.0–10.5)

## 2014-10-04 MED ORDER — NAPROXEN 500 MG PO TABS
500.0000 mg | ORAL_TABLET | Freq: Two times a day (BID) | ORAL | Status: DC
  Administered 2014-10-04 – 2014-10-05 (×3): 500 mg via ORAL
  Filled 2014-10-04 (×3): qty 1

## 2014-10-04 NOTE — Progress Notes (Signed)
Patient is doing well.  She is ambulating, voiding, tolerating PO.  Pain control is good.  More cramping/LBP.  Lochia is appropriate  Filed Vitals:   10/03/14 0900 10/03/14 1300 10/03/14 1743 10/04/14 0555  BP: 132/58 129/68 131/73 134/71  Pulse: 79 81 89 95  Temp: 98.3 F (36.8 C) 98 F (36.7 C) 98.1 F (36.7 C) 97.8 F (36.6 C)  TempSrc: Oral Oral Oral Oral  Resp: Height:      Weight:      SpO2:        NAD Fundus firm Ext: 1+ edema b/l  Lab Results  Component Value Date   WBC 15.5* 10/04/2014   HGB 10.1* 10/04/2014   HCT 30.8* 10/04/2014   MCV 85.3 10/04/2014   PLT 232 10/04/2014    --/--/O POS, O POS (08/31 0405)/RImmune  A/P 34 y.o. G1P1001 PPD#1 s/p TSVD. Routine care.   Expect d/c tomorrow .    [facility] GEFFEL [employer]

## 2014-10-04 NOTE — Progress Notes (Signed)
PT. INFORMED OF HOW TO USE DEBP AND PUMP SET UP DONE.Marland Kitchen PT PUMPING BILATERALLY. ONE DROP COLOSTRUM OBTAINED. PT  ENCOURAGED TO BREASTFEED INFANT FIRST THEN PUMP X 15 MINUTES AND SUPPLEMENT INFANT AFTER BREASTFEEDING IF INFANT NOT GETTING ANY  COLOSTRUM

## 2014-10-04 NOTE — Lactation Note (Signed)
This note was copied from the chart of Brittney Ramos. Lactation Consultation Note  Follow up visit made.  Mom is worried baby is not getting enough.  She states baby has been fussy but refusing the breast.  Baby's last feeding was 5 hours ago.  Discussed monitoring weight, output and baby's cues assist in evaluating if intake is sufficient.  Mom would like to supplement with small amounts of formula.  Assisted with latching baby to right breast.  Breast tissue is taut due to large implants.  With good breast compression baby latched after a few attempts and nursed for 10 minutes.  A few swallows heard.  Recommended we initiate DEBP but mom hesitant and states she will call out later if she would like to start.  Assisted with bottle feeding and baby took 20 mls eagerly.  Encouraged to call for assist.  Patient Name: Brittney Ramos [id number] Date: 10/04/2014 Reason for consult: Follow-up assessment;Breast surgery   Maternal Data    Feeding Feeding Type: Breast Fed Length of feed: 10 min  LATCH Score/Interventions Latch: Grasps breast easily, tongue down, lips flanged, rhythmical sucking. Intervention(s): Adjust position;Assist with latch;Breast massage;Breast compression  Audible Swallowing: A few with stimulation Intervention(s): Hand expression Intervention(s): Alternate breast massage  Type of Nipple: Flat Intervention(s): Hand pump  Comfort (Breast/Nipple): Filling, red/small blisters or bruises, mild/mod discomfort  Problem noted: Mild/Moderate discomfort Interventions (Mild/moderate discomfort): Comfort gels  Hold (Positioning): Assistance needed to correctly position infant at breast and maintain latch. Intervention(s): Breastfeeding basics reviewed;Support Pillows;Position options  LATCH Score: 6  Lactation Tools Discussed/Used     Consult Status Consult Status: Follow-up Date: 10/05/14 Follow-up type: In-patient    Huston Foley 10/04/2014, 3:37  PM

## 2014-10-04 NOTE — Anesthesia Postprocedure Evaluation (Signed)
Anesthesia Post Note  Patient: Brittney Ramos  Procedure(s) Performed: * No procedures listed *  Anesthesia type: Epidural  Patient location: Mother/Baby  Post pain: Pain level controlled  Post assessment: Post-op Vital signs reviewed  Last Vitals:  Filed Vitals:   10/04/14 0555  BP: 134/71  Pulse: 95  Temp: 36.6 C  Resp: 18    Post vital signs: Reviewed  Level of consciousness:alert  Complications: No apparent anesthesia complications

## 2014-10-04 NOTE — Discharge Summary (Signed)
Obstetric Discharge Summary Reason for Admission: induction of labor Prenatal Procedures: none Intrapartum Procedures: spontaneous vaginal delivery Postpartum Procedures: none Complications-Operative and Postpartum: 2 degree perineal laceration HEMOGLOBIN  Date Value Ref Range Status  10/04/2014 10.1* 12.0 - 15.0 g/dL Final   HCT  Date Value Ref Range Status  10/04/2014 30.8* 36.0 - 46.0 % Final    Physical Exam:  General: alert, cooperative and appears stated age 34: appropriate Uterine Fundus: firm DVT Evaluation: No evidence of DVT seen on physical exam.  Discharge Diagnoses: Term Pregnancy-delivered  Discharge Information: Date: 10/04/2014 Activity: pelvic rest Diet: routine Medications: Percocet and naproxen Condition: stable Instructions: refer to practice specific booklet Discharge to: home Follow-up Information    Follow up with CALLAHAN, SIDNEY, DO In 4 weeks.   Specialty:  Obstetrics and Gynecology   Contact information:   [address] [place] [address] [phone]       Newborn Data: Live born female  Birth Weight: 9 lb 5.4 oz (4235 g) APGAR: 9, 9  Home with mother.  Brittney Ramos Brittney Ramos 10/04/2014, 8:54 AM

## 2014-10-04 NOTE — Discharge Instructions (Signed)

## 2014-10-05 MED ORDER — NAPROXEN 500 MG PO TABS: 500.0000 mg | ORAL_TABLET | Freq: Two times a day (BID) | ORAL | Status: DC

## 2014-10-05 MED ORDER — OXYCODONE-ACETAMINOPHEN 5-325 MG PO TABS: 1.0000 | ORAL_TABLET | Freq: Four times a day (QID) | ORAL | Status: DC | PRN

## 2014-10-05 NOTE — Lactation Note (Signed)
This note was copied from the chart of Brittney Marshawn Ninneman. Lactation Consultation Note Requesting to do SNS until her milk comes in. Mom has large implants in breast and is worried about milk production. Encouraged to post pump after BF. Mom doesn't have a DEBP at home, has hand pump from here and will do that until she can get one. Discussed renting one from here with cost information. Mom states she didn't want to rent one.  Stressed the importance of supply and demand espeacially after implants. Has bad scar tissue under areola where nipples were removed.  Hand expression taught w/good flow of colostrum more so from Lt. Breast, collected 7ml. Gave in syring at the breast. Taught application of SNS, set up and cleansing. Baby latched needing multiple relatching to obtain deep and wide flange and latch. Chin tug demonstrated.  Discussed engorgement, monitoring I&O and breast massage w/BF. Mom is going to [facility] and will f/u with BF classes there.   Patient Name: Brittney Ramos Date: 10/05/2014 Reason for consult: Follow-up assessment;Difficult latch   Maternal Data    Feeding Feeding Type: Breast Milk with Formula added Nipple Type: Slow - flow Length of feed: 20 min  LATCH Score/Interventions Latch: Repeated attempts needed to sustain latch, nipple held in mouth throughout feeding, stimulation needed to elicit sucking reflex. Intervention(s): Adjust position;Assist with latch;Breast massage;Breast compression  Audible Swallowing: Spontaneous and intermittent Intervention(s): Skin to skin;Hand expression Intervention(s): Skin to skin;Hand expression;Alternate breast massage  Type of Nipple: Everted at rest and after stimulation Intervention(s): Double electric pump  Comfort (Breast/Nipple): Filling, red/small blisters or bruises, mild/mod discomfort  Problem noted: Mild/Moderate discomfort Interventions (Mild/moderate discomfort): Hand massage;Hand expression;Comfort  gels;Post-pump  Hold (Positioning): Assistance needed to correctly position infant at breast and maintain latch. Intervention(s): Breastfeeding basics reviewed;Support Pillows;Position options;Skin to skin  LATCH Score: 7  Lactation Tools Discussed/Used Tools: Pump;Supplemental Nutrition System;Comfort gels Breast pump type: Double-Electric Breast Pump   Consult Status Consult Status: Complete Date: 10/05/14    Charyl Dancer 10/05/2014, 12:10 PM

## 2016-11-30 ENCOUNTER — Encounter: Payer: Self-pay | Admitting: Allergy

## 2016-11-30 ENCOUNTER — Ambulatory Visit (INDEPENDENT_AMBULATORY_CARE_PROVIDER_SITE_OTHER): Payer: Medicaid Other | Admitting: Allergy

## 2016-11-30 VITALS — BP 118/62 | HR 76 | Temp 98.5°F | Resp 18 | Ht 69.0 in | Wt 185.2 lb

## 2016-11-30 DIAGNOSIS — J309 Allergic rhinitis, unspecified: Secondary | ICD-10-CM | POA: Diagnosis not present

## 2016-11-30 DIAGNOSIS — T781XXA Other adverse food reactions, not elsewhere classified, initial encounter: Secondary | ICD-10-CM | POA: Diagnosis not present

## 2016-11-30 DIAGNOSIS — H1010 Acute atopic conjunctivitis, unspecified eye: Secondary | ICD-10-CM

## 2016-11-30 DIAGNOSIS — L2089 Other atopic dermatitis: Secondary | ICD-10-CM

## 2016-11-30 MED ORDER — IPRATROPIUM BROMIDE 0.06 % NA SOLN: NASAL | 5 refills | Status: AC

## 2016-11-30 MED ORDER — AZELASTINE HCL 0.1 % NA SOLN: NASAL | 5 refills | Status: AC

## 2016-11-30 NOTE — Patient Instructions (Signed)
Allergic rhinoconjunctivitis     - your previous testing shows sensitivity to dust mite     - allergen avoidance measures discussed and you have taken the appropriate measures already to decrease your exposure to dust mites.      - will have you trial Xyzal 5mg  daily --- this is the last OTC long-acting antihistamine that you have not tried yet     - use Astelin (nasal antihistamine) 2 sprays each nostril twice a day for nasal drainage/post-nasal drip     - may use your Flonase 2 spray each nostril daily for nasal congestion     - may use nasal Atrovent 2 sprays each nostril as needed up to 3-4 times a day.  Use prior to known situations that cause runny nose like [place] [facility] [employer].       - use nasal saline rinse to help clean/flush out nose prior to use of nasal sprays     - we have discussed both allergen immunotherapy (traditional shots) as well as sublingual therapy with Isaiah Sergedactra.   You may visit [email or web address] as well for further information.   If you are interested in this treatment option let us know.     Follow-up 3-4 months of sooner if needed

## 2016-11-30 NOTE — Progress Notes (Signed)
New Patient Note  RE: Brittney Ramos MRN: [id number] DOB: 1980-04-17 Date of Office Visit: 11/30/2016  Referring provider: Patrick Jupiter, NP Primary care provider: Patrick Jupiter, NP  Chief Complaint:  Persistent runny nose  History of present illness: Brittney Ramos is a 36 y.o. female presenting today for consultation for allergic rhinitis.  She has a long standing history of rhinorrhea but feel it has gotten worse.  She recently had serum IgE testing to environmental and foods and was only sensitized to both dust mites. She then skin prick testing done about 2 weeks ago at PCP office that per pt only showed dust mite sensitivity (however skin testing results difficult to determine as no negative control was documented).  She was previously used flonase and sudafed which she reports wasn't working.  She was then recommended to try zyrtec which she didn't feel helped then she tried allegra and claritin also which didn't help.  She also tried singulair for about 2-4 weeks and did not notice any improvements.  She has also tried dymista only using 1 spray daily and also did not find it to be effective.  She has taken many measures in her home to decrease her exposure to dust mites including getting rid of her foam mattress topper, shampooing carpeting, new pillows, freezing children's stuff animals and getting a 3 stage hepa filter unit for her forced air unit.      She is currently being treated for an ear infection with amoxiclin and prednisone for 7 days.  She reports getting sinus infections about 1-2 times a year.  She does report sinus HA with her nasal symptoms.     She is going to ENT later today and will await recommendations from them as well.  .      She has no history of asthma.  She does report eczema and uses triamcinolone with flares and moisturized with cetaphil and CeraVe.     She does report with cantoloupe her throat feels scratchy and has a throat tightness feeling and thus  she avoids.  Watermelon has less degree of symptoms than cantoloupe and she does choose to still eat watermelon.  Grapfruit has caused lip swelling.  She states with she is handling lemon, tomato, figs, pumpkin her skin gets itchy and may cause some blistering/peeling.   She does feel that she is allergic dogs (despite negative testing) as she develops hives around other people's dogs and same with cat exposure.   Review of systems: Review of Systems  Constitutional: Negative for chills, fever and malaise/fatigue.  HENT: Positive for congestion and ear pain. Negative for ear discharge, hearing loss, nosebleeds, sinus pain, sore throat and tinnitus.   Eyes: Negative for pain, discharge and redness.  Respiratory: Negative for cough, sputum production, shortness of breath and wheezing.   Cardiovascular: Negative for chest pain.  Gastrointestinal: Negative for abdominal pain, constipation, diarrhea, heartburn, nausea and vomiting.  Musculoskeletal: Negative for joint pain.  Skin: Negative for itching and rash.  Neurological: Positive for headaches. Negative for dizziness.    All other systems negative unless noted above in HPI  Past medical history: Past Medical History:  Diagnosis Date  . Arthritis     Past surgical history: Past Surgical History:  Procedure Laterality Date  . BREAST SURGERY    . LIPOSUCTION    . RHINOPLASTY    . TONSILLECTOMY      Family history:  Family History  Problem Relation Age of Onset  .  Arthritis Mother   . Diabetes Father   . Heart disease Father   . Hyperlipidemia Father   . Hypertension Father   . Arthritis Father   . Cancer Maternal Uncle   . Diabetes Maternal Grandmother   . Alcohol abuse Neg Hx   . Asthma Neg Hx   . Birth defects Neg Hx   . COPD Neg Hx   . Depression Neg Hx   . Early death Neg Hx   . Drug abuse Neg Hx   . Hearing loss Neg Hx   . Kidney disease Neg Hx   . Learning disabilities Neg Hx   . Mental illness Neg Hx   .  Mental retardation Neg Hx   . Miscarriages / Stillbirths Neg Hx   . Stroke Neg Hx   . Vision loss Neg Hx   . Varicose Veins Neg Hx     Social history: She lives in a home with carpeting with electric heating and forced air.  There are severel dogs inside the home. There are dogs in the home and chickens, horses outside home.  She is a SAHM.    Social History Main Topics  . Smoking status: Former Smoker    Quit date: 09/25/2011  . Smokeless tobacco: Never Used    Medication List: Medication List       Accurate as of 11/30/16  3:22 PM. Always use your most recent med list.          amoxicillin 500 MG tablet Commonly known as:  AMOXIL Take 500 mg by mouth every 8 (eight) hours. 7- Day Course   azelastine 0.1 % nasal spray Commonly known as:  ASTELIN Use two sprays in each nostril twice daily as directed.   ipratropium 0.06 % nasal spray Commonly known as:  ATROVENT Can use two sprays in each nostril three to four times daily if needed for runny nose   lisdexamfetamine 40 MG capsule Commonly known as:  VYVANSE Take 40 mg by mouth daily.   predniSONE 20 MG tablet Commonly known as:  DELTASONE Take 20 mg by mouth daily. 7 Day Course   SUDAFED PO Take by mouth as needed.   VIIBRYD 20 MG Tabs Generic drug:  Vilazodone HCl Take by mouth daily.       Known medication allergies: Allergies  Allergen Reactions  . Grapefruit Concentrate Anaphylaxis  . Other Anaphylaxis    Cantelope, watermelon, diet mountain dew, diet lipton tea  . Ibuprofen Nausea Only  . Robitussin (Alcohol Free) [Guaifenesin] Other (See Comments)    Unknown childhood  . Latex Rash and Itching    Hands only     Physical examination: Blood pressure 118/62, pulse 76, temperature 98.5 F (36.9 C), temperature source Oral, resp. rate 18, height 5\' 9"  (1.753 m), weight 185 lb 3.2 oz (84 kg), unknown if currently breastfeeding.  General: Alert, interactive, in no acute distress. HEENT: PERRLA,  TMs pearly gray, turbinates mildly edematous with clear discharge, post-pharynx non erythematous. Neck: Supple without lymphadenopathy. Lungs: Clear to auscultation without wheezing, rhonchi or rales. {no increased work of breathing. CV: Normal S1, S2 without murmurs. Abdomen: Nondistended, nontender. Skin: Warm and dry, without lesions or rashes. Extremities:  No clubbing, cyanosis or edema. Neuro:   Grossly intact.  Diagnositics/Labs: Labs:  serum IgE in kU/L: d. pte 1.33, d. Far 1.5 Negative serum IgE to environmentals: cat, dog, [place], timothy, johnson, bahia, cockroach, penicillium, cladosporium, aspergillus, mucor, alternaria, stemphylium, maple/box elder, birch, mountain cedar, white oak, elm, sycamore, hickory,  mulberry, sweet gum, ragweed, mugwort, english plantain, pigweed, sheep sorrel, netlle, k. Bluegrass, fusarium, epicoccum, cow dander, goose feathers, mouse urine, chicken feathers, duck feathers, candida, setomelanomma, aureobasidi, phoma betae; foods: pork, beef, chocolate, egg, seafood mix, crab, lobster, oyster, egg, milk, cod, wheat, corn, sesame, peanut, soybean, hazelnut, [place] nut, almond, shrimp, pecan, cashew, pistachio, clam, walnut, scallop, macadamia nut  CBC wnl CMP wnl except glucose 124 Vit D wnl  All labs will be scanned into EMR  Allergy testing: deferred due to recent testing  Assessment and plan:   Allergic rhinoconjunctivitis     - your previous testing shows sensitivity to dust mite.   Her serum IgE testing to environmental and foods was thorough (however did not include testing for the foods below except for tomato, which is reported as neg per pt).  Serum IgE only showing dust mite sensitivity.   Skin testing is difficult to interpret as no negative control was performed and every allergen was documented to have at least a 3mm wheal however it was reported only dust mite allergic.  However there are 2 grass allergens with a 7mm wheal reported  negative and many allergens with 5 mm wheal report negative.  I advised pt that based on serum IgE appears she is only dust mite allergic.  We can re-evaluate this testing in a year by skin testing.       - allergen avoidance measures discussed and you have taken the appropriate measures already to decrease your exposure to dust mites.      - will have you trial Xyzal 5mg  daily --- this is the last OTC long-acting antihistamine that you have not tried yet     - use Astelin (nasal antihistamine) 2 sprays each nostril twice a day for nasal drainage/post-nasal drip     - may use your Flonase 2 spray each nostril daily for nasal congestion     - may use nasal Atrovent 2 sprays each nostril as needed up to 3-4 times a day.  Use prior to known situations that cause runny nose like days you go to [employer].       - use nasal saline rinse to help clean/flush out nose prior to use of nasal sprays     - we have discussed both allergen immunotherapy (traditional shots) as well as sublingual therapy with Isaiah Serge.   You may visit [email or web address] as well for further information.   If you are interested in this treatment option let us know and we will see if covered by insurance.      Adverse food reaction    - she will continue avoidance of foods causing oral symptoms including cantaloupe and grapefruit and avoid handling of lemon, tomato, fig, pumpkin.  Option to test was discussed and will await this and have her avoid for now.  Oral symptoms would be consistent with PFAS if she is indeed pollen allergic.    Eczema    - she will continue as needed use of triamcinolone and daily moisturization with Cetaphil or Cerave.   Follow-up 3-4 months of sooner if needed  I appreciate the opportunity to take part in Khylee's care. Please do not hesitate to contact me with questions.  Sincerely,   Margo Aye, MD Allergy/Immunology [facility]

## 2016-12-10 ENCOUNTER — Telehealth: Payer: Self-pay | Admitting: *Deleted

## 2016-12-10 NOTE — Telephone Encounter (Signed)
-----   [facility] Pad[facility], MD sent at 12/10/2016 10:18 AM EST ----- Regarding: check-in/ent notes Can one of you see if we can get the ENT records for her.  She was going to see ENT there on 10/30.    Her insurance also does not cover Odactra (dust mite immunotherapy).   Thus if she wants to perform immunotherapy for dust mite allergy the only option would be allergy shots.  Please see if she would like to proceed with allergy shots or continue medication management.      Thanks.

## 2016-12-10 NOTE — Telephone Encounter (Signed)
I attempted to call [facility] but her voicemail is not set up.  I want to confirm which ENT she went to before I call [facility] to request records.

## 2016-12-13 NOTE — Telephone Encounter (Signed)
Oh great to hear the medications are working for her!  If she ever becomes interested in allergen immunotherapy she can let us know.   Yes it would be great to see what ENT performed/recommended.

## 2016-12-13 NOTE — Telephone Encounter (Signed)
[facility] and did request that they send records here but it appears that did not happen. I will contact them to have them sent. Also, she states that she is responding very well to the medication management plan provided by Dr. Delorse LekPadgett, therefore she does not want to begin immunotherapy at this time.

## 2016-12-13 NOTE — Telephone Encounter (Signed)
She has to go by and sign a records release which she said she would do most likely on Wednesday.

## 2019-10-04 ENCOUNTER — Encounter: Payer: Self-pay | Admitting: Obstetrics & Gynecology

## 2019-10-04 ENCOUNTER — Other Ambulatory Visit (HOSPITAL_COMMUNITY)
Admission: RE | Admit: 2019-10-04 | Discharge: 2019-10-04 | Disposition: A | Discharge status: Home / Self Care | Payer: Medicaid Other | Source: Ambulatory Visit | Attending: Obstetrics & Gynecology | Admitting: Obstetrics & Gynecology

## 2019-10-04 ENCOUNTER — Ambulatory Visit (INDEPENDENT_AMBULATORY_CARE_PROVIDER_SITE_OTHER): Payer: Medicaid Other | Admitting: Obstetrics & Gynecology

## 2019-10-04 ENCOUNTER — Other Ambulatory Visit: Payer: Self-pay

## 2019-10-04 VITALS — BP 116/76 | HR 80 | Wt 211.6 lb

## 2019-10-04 DIAGNOSIS — Z01419 Encounter for gynecological examination (general) (routine) without abnormal findings: Secondary | ICD-10-CM

## 2019-10-04 DIAGNOSIS — R635 Abnormal weight gain: Secondary | ICD-10-CM | POA: Diagnosis not present

## 2019-10-04 NOTE — Progress Notes (Signed)
NGYN presents for annual and pap c/o menorrhagia Pt c/o excessive weight gain - 211lbs today, 176lbs in July  Pap overdue  PHQ9 = 19

## 2019-10-04 NOTE — Progress Notes (Signed)
Patient ID: Brittney Ramos, female   DOB: 04/21/1980, 39 y.o.   MRN: [id number]  Chief Complaint  Patient presents with  . New Patient (Initial Visit)  Weight gain in 3 months  HPI Brittney Ramos is a 39 y.o. female.  G1P1001 Patient's last menstrual period was 09/02/2019. Patient comes today with concern about weight gain of 30 pounds in about 34 months, questioning if problems with her estrogen level may be the cause. Her menses are regular and not unusually heavy. She uses spermacide for BCM. She states that she took medication for ADHD until 4-5 months ago. Her PCP ordered extensive labs including thyroid panel and estrogen level Other referrals were also made. HPI  Past Medical History:  Diagnosis Date  . Arthritis     Past Surgical History:  Procedure Laterality Date  . BREAST SURGERY    . LIPOSUCTION    . RHINOPLASTY    . TONSILLECTOMY      Family History  Problem Relation Age of Onset  . Arthritis Mother   . Diabetes Father   . Heart disease Father   . Hyperlipidemia Father   . Hypertension Father   . Arthritis Father   . Cancer Maternal Uncle   . Diabetes Maternal Grandmother   . Alcohol abuse Neg Hx   . Asthma Neg Hx   . Birth defects Neg Hx   . COPD Neg Hx   . Depression Neg Hx   . Early death Neg Hx   . Drug abuse Neg Hx   . Hearing loss Neg Hx   . Kidney disease Neg Hx   . Learning disabilities Neg Hx   . Mental illness Neg Hx   . Mental retardation Neg Hx   . Miscarriages / Stillbirths Neg Hx   . Stroke Neg Hx   . Vision loss Neg Hx   . Varicose Veins Neg Hx     Social History Social History   Tobacco Use  . Smoking status: Former Smoker    Years: 15.00    Types: Cigarettes    Quit date: 09/25/2011    Years since quitting: 8.0  . Smokeless tobacco: Former Engineer, water Use Topics  . Alcohol use: Not Currently  . Drug use: No    Allergies  Allergen Reactions  . Grapefruit Concentrate Anaphylaxis  . Other Anaphylaxis     Cantelope, watermelon, diet mountain dew, diet lipton tea  . Ibuprofen Nausea Only  . Robitussin (Alcohol Free) [Guaifenesin] Other (See Comments)    Unknown childhood  . Latex Rash and Itching    Hands only    Current Outpatient Medications  Medication Sig Dispense Refill  . azelastine (ASTELIN) 0.1 % nasal spray Use two sprays in each nostril twice daily as directed. 30 mL 5  . amoxicillin (AMOXIL) 500 MG tablet Take 500 mg by mouth every 8 (eight) hours. 7- Day Course (Patient not taking: Reported on 10/04/2019)    . ipratropium (ATROVENT) 0.06 % nasal spray Can use two sprays in each nostril three to four times daily if needed for runny nose (Patient not taking: Reported on 10/04/2019) 15 mL 5  . lisdexamfetamine (VYVANSE) 40 MG capsule Take 40 mg by mouth daily. (Patient not taking: Reported on 10/04/2019)    . predniSONE (DELTASONE) 20 MG tablet Take 20 mg by mouth daily. 7 Day Course (Patient not taking: Reported on 10/04/2019)    . Pseudoephedrine HCl (SUDAFED PO) Take by mouth as needed. (Patient not taking: Reported on 10/04/2019)    .  Vilazodone HCl (VIIBRYD) 20 MG TABS Take by mouth daily. (Patient not taking: Reported on 10/04/2019)     No current facility-administered medications for this visit.    Review of Systems Review of Systems  Constitutional: Negative for unexpected weight change.  Respiratory: Negative.   Gastrointestinal: Negative.   Genitourinary: Negative for dyspareunia, menstrual problem, vaginal bleeding and vaginal discharge.  Psychiatric/Behavioral: Negative.     Blood pressure 116/76, pulse 80, weight 211 lb 9.6 oz (96 kg), last menstrual period 09/02/2019, unknown if currently breastfeeding.  Physical Exam Physical Exam Vitals and nursing note reviewed. Exam conducted with a chaperone present.  Constitutional:      Appearance: She is not ill-appearing.  Eyes:     Pupils: Pupils are equal, round, and reactive to light.  Cardiovascular:     Rate and Rhythm:  Normal rate.  Pulmonary:     Effort: Pulmonary effort is normal.  Abdominal:     General: Abdomen is flat.     Palpations: Abdomen is soft.  Genitourinary:    Comments: Pelvic exam: normal external genitalia, vulva, vagina, cervix, uterus and adnexa.  Musculoskeletal:        General: Normal range of motion.     Cervical back: Normal range of motion.  Skin:    General: Skin is warm and dry.  Neurological:     Mental Status: She is alert.  Psychiatric:        Mood and Affect: Mood normal.        Behavior: Behavior normal.   Breasts: surgical scars noted mammoplasty scars, saline implant, no masses.   Data Reviewed Labs ordered by PCP reviewed and discussed, nl thyroid and estrogen, androgens  Assessment Recent undesired weight gain, suspect side effect of stopping ADHD medication O/w well woman exam done today  Plan F/u on her pap result Discuss the treatment she took for ADHD with the prescriber Keep other appointments for which she has been referred Consider at least using condoms with spermacide for more reliable BCM    [name] 10/04/2019, 3:35 PM

## 2019-10-05 LAB — CERVICOVAGINAL ANCILLARY ONLY
Bacterial Vaginitis (gardnerella): NEGATIVE
Candida Glabrata: NEGATIVE
Candida Vaginitis: NEGATIVE
Chlamydia: NEGATIVE
Comment: NEGATIVE
Comment: NEGATIVE
Comment: NEGATIVE
Comment: NEGATIVE
Comment: NEGATIVE
Comment: NORMAL
Neisseria Gonorrhea: NEGATIVE
Trichomonas: NEGATIVE

## 2019-10-09 LAB — CYTOLOGY - PAP
Comment: NEGATIVE
Diagnosis: NEGATIVE
High risk HPV: NEGATIVE

## 2020-10-02 ENCOUNTER — Ambulatory Visit (INDEPENDENT_AMBULATORY_CARE_PROVIDER_SITE_OTHER): Payer: Medicaid Other

## 2020-10-02 ENCOUNTER — Encounter (HOSPITAL_BASED_OUTPATIENT_CLINIC_OR_DEPARTMENT_OTHER): Payer: Self-pay | Admitting: Cardiology

## 2020-10-02 ENCOUNTER — Ambulatory Visit (HOSPITAL_BASED_OUTPATIENT_CLINIC_OR_DEPARTMENT_OTHER): Payer: Medicaid Other | Admitting: Cardiology

## 2020-10-02 ENCOUNTER — Other Ambulatory Visit: Payer: Self-pay

## 2020-10-02 VITALS — BP 116/68 | HR 65 | Ht 68.0 in | Wt 221.0 lb

## 2020-10-02 DIAGNOSIS — R0600 Dyspnea, unspecified: Secondary | ICD-10-CM | POA: Diagnosis not present

## 2020-10-02 DIAGNOSIS — R55 Syncope and collapse: Secondary | ICD-10-CM | POA: Diagnosis not present

## 2020-10-02 DIAGNOSIS — R0609 Other forms of dyspnea: Secondary | ICD-10-CM

## 2020-10-02 DIAGNOSIS — R002 Palpitations: Secondary | ICD-10-CM

## 2020-10-02 DIAGNOSIS — Z7189 Other specified counseling: Secondary | ICD-10-CM

## 2020-10-02 NOTE — Patient Instructions (Signed)
Medication Instructions:  Your Physician recommend you continue on your current medication as directed.    *If you need a refill on your cardiac medications before your next appointment, please call your pharmacy*   Lab Work: None ordered today   Testing/Procedures: Your physician has requested that you have an echocardiogram. Echocardiography is a painless test that uses sound waves to create images of your heart. It provides your doctor with information about the size and shape of your heart and how well your heart's chambers and valves are working. This procedure takes approximately one hour. There are no restrictions for this procedure. [address]  Our physician has recommended that you wear an  7 DAY ZIO-PATCH monitor. The Zio patch cardiac monitor continuously records heart rhythm data for up to 14 days, this is for patients being evaluated for multiple types heart rhythms. For the first 24 hours post application, please avoid getting the Zio monitor wet in the shower or by excessive sweating during exercise. After that, feel free to carry on with regular activities. Keep soaps and lotions away from the ZIO XT Patch.       Follow-Up: At [facility], you and your health needs are our priority.  As part of our continuing mission to provide you with exceptional heart care, we have created designated Provider Care Teams.  These Care Teams include your primary Cardiologist (physician) and Advanced Practice Providers (APPs -  Physician Assistants and Nurse Practitioners) who all work together to provide you with the care you need, when you need it.  We recommend signing up for the patient portal called "MyChart".  Sign up information is provided on this After Visit Summary.  MyChart is used to connect with patients for Virtual Visits (Telemedicine).  Patients are able to view lab/test results, encounter notes, upcoming appointments, etc.  Non-urgent messages can be sent  to your provider as well.   To learn more about what you can do with MyChart, go to [email or web address].    Your next appointment:   4 month(s)  The format for your next appointment:   In Person  Provider:   Jodelle Red, MD  Christena Deem- Long Term Monitor Instructions  Your physician has requested you wear a ZIO patch monitor for 7 days.  This is a single patch monitor. [employer] supplies one patch monitor per enrollment. Additional stickers are not available. Please do not apply patch if you will be having a Nuclear Stress Test,  Echocardiogram, Cardiac CT, MRI, or Chest Xray during the period you would be wearing the  monitor. The patch cannot be worn during these tests. You cannot remove and re-apply the  ZIO XT patch monitor.  Your ZIO patch monitor will be mailed 3 day USPS to your address on file. It may take 3-5 days  to receive your monitor after you have been enrolled.  Once you have received your monitor, please review the enclosed instructions. Your monitor  has already been registered assigning a specific monitor serial # to you.  Billing and Patient Assistance Program Information  We have supplied [employer] with any of your insurance information on file for billing purposes. [employer] offers a sliding scale Patient Assistance Program for patients that do not have  insurance, or whose insurance does not completely cover the cost of the ZIO monitor.  You must apply for the Patient Assistance Program to qualify for this discounted rate.  To apply, please call [employer] at [phone], select option 4, select  option 2, ask to apply for  Patient Assistance Program. Meredeth Ide will ask your household income, and how many people  are in your household. They will quote your out-of-pocket cost based on that information.  [employer] will also be able to set up a 54-month, interest-free payment plan if needed.  Applying the monitor   Shave hair from upper left chest.  Hold  abrader disc by orange tab. Rub abrader in 40 strokes over the upper left chest as  indicated in your monitor instructions.  Clean area with 4 enclosed alcohol pads. Let dry.  Apply patch as indicated in monitor instructions. Patch will be placed under collarbone on left  side of chest with arrow pointing upward.  Rub patch adhesive wings for 2 minutes. Remove white label marked "1". Remove the white  label marked "2". Rub patch adhesive wings for 2 additional minutes.  While looking in a mirror, press and release button in center of patch. A small green light will  flash 3-4 times. This will be your only indicator that the monitor has been turned on.  Do not shower for the first 24 hours. You may shower after the first 24 hours.  Press the button if you feel a symptom. You will hear a small click. Record Date, Time and  Symptom in the Patient Logbook.  When you are ready to remove the patch, follow instructions on the last 2 pages of Patient  Logbook. Stick patch monitor onto the last page of Patient Logbook.  Place Patient Logbook in the blue and white box. Use locking tab on box and tape box closed  securely. The blue and white box has prepaid postage on it. Please place it in the mailbox as  soon as possible. Your physician should have your test results approximately 7 days after the  monitor has been mailed back to [facility].  Call [facility] Customer Care at [phone] if you have questions regarding  your ZIO XT patch monitor. Call them immediately if you see an orange light blinking on your  monitor.  If your monitor falls off in less than 4 days, contact our Monitor department at [phone].  If your monitor becomes loose or falls off after 4 days call [employer] at [phone] for  suggestions on securing your monitor

## 2020-10-02 NOTE — Progress Notes (Signed)
Cardiology Office Note:    Date:  10/02/2020   ID:  Brittney Ramos, DOB [birth year], MRN [id number]  PCP:  Patrick Jupiter, NP  Cardiologist:  Jodelle Red, MD  Referring MD: Galvin Proffer, MD   CC: new patient evaluation for shortness of breath, racing heart rate, syncope  History of Present Illness:    Brittney Ramos is a 40 y.o. female with a hx of arthritis who is seen as a new consult at the request of Brittney, Imran P, MD for the evaluation and management of shortness of breath and elevated heart rate/palpitations and episode of syncope.  Cardiovascular risk factors: Prior clinical ASCVD: none Comorbid conditions, including hypertension, hyperlipidemia, diabetes, chronic kidney disease: none Metabolic syndrome/Obesity: BMI 33 Chronic inflammatory conditions: none Tobacco use history: former Family history: Father had 12 stents Prior cardiac testing and/or incidental findings on other testing (ie coronary calcium): Exercise level: Going to buy groceries and loading them into the car is enough to cause exhaustion and shortness of breath. However, while in an air-conditioned gym she is able to walk and exercise for hours with no issues. Monitors her heart rate which is normally 115-116 with high intensity workouts, resting HR 68-70. Out in the heat and humidity, her heart rate becomes 130-140 with no exertion. In the fall, she jogged for miles while outside. 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 Never sedentary. Exercises at home with yoga and elliptical. Current diet: Plant based diet. She makes sure to stay hydrated. The majority of her caffeine intake is drinking carbonated water.  Today, she is accompanied by her daughter.   She reports her first heat-stroke was in 2001 while riding a horse. She blacked out and fell. Since then, she has had issues with reactions to hot weather. Her heat-stroke episodes include symptoms of dizziness, lightheadedness,  nausea, upper extremity numbness, hives, hoarseness, fatigue, and generally feeling ill. Of note, she states she will pass out if cooled down too quickly. If she notices an episode occurring she will make sure to lie down and elevate her legs.  At times she has experienced random palpitations after drinking caffeine.  By the end of the day she has bilateral LE edema, which will dissipate by the morning. She uses compression socks and elevates her legs while lying down.  Typically she does not sleep well.  In 03/2020 she was infected with COVID. She is vaccinated.  She denies any chest pain, headaches, orthopnea, or PND.    Past Medical History:  Diagnosis Date   Arthritis     Past Surgical History:  Procedure Laterality Date   BREAST SURGERY     LIPOSUCTION     RHINOPLASTY     TONSILLECTOMY      Current Medications: Current Outpatient Medications on File Prior to Visit  Medication Sig   amoxicillin-clavulanate (AUGMENTIN) 500-125 MG tablet Take 1 tablet by mouth every 8 (eight) hours.   amoxicillin (AMOXIL) 500 MG tablet Take 500 mg by mouth every 8 (eight) hours. 7- Day Course (Patient not taking: Reported on 10/02/2020)   azelastine (ASTELIN) 0.1 % nasal spray Use two sprays in each nostril twice daily as directed. (Patient not taking: Reported on 10/02/2020)   ipratropium (ATROVENT) 0.06 % nasal spray Can use two sprays in each nostril three to four times daily if needed for runny nose (Patient not taking: Reported on 10/02/2020)   lisdexamfetamine (VYVANSE) 40 MG capsule Take 40 mg by mouth daily. (Patient not  taking: Reported on 10/02/2020)   predniSONE (DELTASONE) 20 MG tablet Take 20 mg by mouth daily. 7 Day Course (Patient not taking: Reported on 10/02/2020)   Pseudoephedrine HCl (SUDAFED PO) Take by mouth as needed. (Patient not taking: Reported on 10/02/2020)   Vilazodone HCl 20 MG TABS Take by mouth daily. (Patient not taking: Reported on 10/02/2020)   No current  facility-administered medications on file prior to visit.     Allergies:   Grapefruit concentrate, Other, Ibuprofen, Robitussin (alcohol free) [guaifenesin], and Latex   Social History   Tobacco Use   Smoking status: Former    Years: 15.00    Types: Cigarettes    Quit date: 09/25/2011    Years since quitting: 9.0   Smokeless tobacco: Former  Substance Use Topics   Alcohol use: Not Currently   Drug use: No    Family History: family history includes Arthritis in her father and mother; Cancer in her maternal uncle; Diabetes in her father and maternal grandmother; Heart disease in her father; Hyperlipidemia in her father; Hypertension in her father. There is no history of Alcohol abuse, Asthma, Birth defects, COPD, Depression, Early death, Drug abuse, Hearing loss, Kidney disease, Learning disabilities, Mental illness, Mental retardation, Miscarriages / Stillbirths, Stroke, Vision loss, or Varicose Veins.  ROS:   Please see the history of present illness.  Additional pertinent ROS: Constitutional: Positive for fatigue. Negative for chills, fever, night sweats, unintentional weight loss  HENT: Negative for ear pain and hearing loss.   Eyes: Negative for loss of vision and eye pain.  Respiratory: Negative for cough, sputum, wheezing.   Cardiovascular: See HPI. Gastrointestinal: Negative for abdominal pain, melena, and hematochezia.  Genitourinary: Negative for dysuria and hematuria.  Musculoskeletal: Negative for falls and myalgias.  Skin: Negative for itching.  Neurological: Positive for loss of consciousness. Negative for focal weakness, focal sensory changes.  Endo/Heme/Allergies: Does not bruise/bleed easily.     EKGs/Labs/Other Studies Reviewed:    The following studies were reviewed today: No prior CV studies available.  EKG:  EKG is personally reviewed.   10/02/2020: Sinus rhythm. Rate 65 bpm.  Recent Labs: No results found for requested labs within last 8760 hours.   Recent Lipid Panel No results found for: CHOL, TRIG, HDL, CHOLHDL, VLDL, LDLCALC, LDLDIRECT  Physical Exam:    VS:  BP 116/68   Pulse 65   Ht  (1.727 m)   Wt 221 lb (100.2 kg)   SpO2 99%   BMI 33.60 kg/m     Wt Readings from Last 3 Encounters:  10/02/20 221 lb (100.2 kg)  10/04/19 211 lb 9.6 oz (96 kg)  11/30/16 185 lb 3.2 oz (84 kg)    GEN: Well nourished, well developed in no acute distress HEENT: Normal, moist mucous membranes NECK: No JVD CARDIAC: regular rhythm, normal S1 and S2, no rubs or gallops. No murmur. VASCULAR: Radial and DP pulses 2+ bilaterally. No carotid bruits RESPIRATORY:  Clear to auscultation without rales, wheezing or rhonchi  ABDOMEN: Soft, non-tender, non-distended MUSCULOSKELETAL:  Ambulates independently SKIN: Warm and dry, no edema NEUROLOGIC:  Alert and oriented x 3. No focal neuro deficits noted. PSYCHIATRIC:  Normal affect    ASSESSMENT:    1. Syncope, unspecified syncope type   2. Heart palpitations   3. Dyspnea on exertion   4. Cardiac risk counseling   5. Counseling on health promotion and disease prevention    PLAN:    Palpitations/tachycardia Shortness of breath on exertion Syncope -ECG unremarkable -will  get monitor, echo for further evaluation -discussed compression, elevation, hydration -reviewed red flag warning signs that need immediate medical attention  Cardiac risk counseling and prevention recommendations: -recommend heart healthy/Mediterranean diet, with whole grains, fruits, vegetable, fish, lean meats, nuts, and olive oil. Limit salt. -recommend moderate walking, 3-5 times/week for 30-50 minutes each session. Aim for at least 150 minutes.week. Goal should be pace of 3 miles/hours, or walking 1.5 miles in 30 minutes -recommend avoidance of tobacco products. Avoid excess alcohol. -ASCVD risk score: The ASCVD Risk score Denman George DC Jr., et al., 2013) failed to calculate for the following reasons:   Cannot find a  previous HDL lab   Cannot find a previous total cholesterol lab    Plan for follow up: 4 months or sooner as needed.  Jodelle Red, MD, PhD, [facility] [place]  [facility]    Medication Adjustments/Labs and Tests Ordered: Current medicines are reviewed at length with the patient today.  Concerns regarding medicines are outlined above.  Orders Placed This Encounter  Procedures   LONG TERM MONITOR (3-14 DAYS)   EKG 12-Lead   ECHOCARDIOGRAM COMPLETE    No orders of the defined types were placed in this encounter.   Patient Instructions  Medication Instructions:  Your Physician recommend you continue on your current medication as directed.    *If you need a refill on your cardiac medications before your next appointment, please call your pharmacy*   Lab Work: None ordered today   Testing/Procedures: Your physician has requested that you have an echocardiogram. Echocardiography is a painless test that uses sound waves to create images of your heart. It provides your doctor with information about the size and shape of your heart and how well your heart's chambers and valves are working. This procedure takes approximately one hour. There are no restrictions for this procedure. [address]  Our physician has recommended that you wear an  7 DAY ZIO-PATCH monitor. The Zio patch cardiac monitor continuously records heart rhythm data for up to 14 days, this is for patients being evaluated for multiple types heart rhythms. For the first 24 hours post application, please avoid getting the Zio monitor wet in the shower or by excessive sweating during exercise. After that, feel free to carry on with regular activities. Keep soaps and lotions away from the ZIO XT Patch.       Follow-Up: At [facility], you and your health needs are our priority.  As part of our continuing mission to provide you with exceptional heart care, we have created designated  Provider Care Teams.  These Care Teams include your primary Cardiologist (physician) and Advanced Practice Providers (APPs -  Physician Assistants and Nurse Practitioners) who all work together to provide you with the care you need, when you need it.  We recommend signing up for the patient portal called "MyChart".  Sign up information is provided on this After Visit Summary.  MyChart is used to connect with patients for Virtual Visits (Telemedicine).  Patients are able to view lab/test results, encounter notes, upcoming appointments, etc.  Non-urgent messages can be sent to your provider as well.   To learn more about what you can do with MyChart, go to [email or web address].    Your next appointment:   4 month(s)  The format for your next appointment:   In Person  Provider:   Jodelle Red, MD  Christena Deem- Long Term Monitor Instructions  Your physician has requested you wear a ZIO patch  monitor for 7 days.  This is a single patch monitor. [employer] supplies one patch monitor per enrollment. Additional stickers are not available. Please do not apply patch if you will be having a Nuclear Stress Test,  Echocardiogram, Cardiac CT, MRI, or Chest Xray during the period you would be wearing the  monitor. The patch cannot be worn during these tests. You cannot remove and re-apply the  ZIO XT patch monitor.  Your ZIO patch monitor will be mailed 3 day USPS to your address on file. It may take 3-5 days  to receive your monitor after you have been enrolled.  Once you have received your monitor, please review the enclosed instructions. Your monitor  has already been registered assigning a specific monitor serial # to you.  Billing and Patient Assistance Program Information  We have supplied [employer] with any of your insurance information on file for billing purposes. [employer] offers a sliding scale Patient Assistance Program for patients that do not have  insurance, or whose insurance does not  completely cover the cost of the ZIO monitor.  You must apply for the Patient Assistance Program to qualify for this discounted rate.  To apply, please call [employer] at [phone], select option 4, select option 2, ask to apply for  Patient Assistance Program. [employer] will ask your household income, and how many people  are in your household. They will quote your out-of-pocket cost based on that information.  [employer] will also be able to set up a 6231-month, interest-free payment plan if needed.  Applying the monitor   Shave hair from upper left chest.  Hold abrader disc by orange tab. Rub abrader in 40 strokes over the upper left chest as  indicated in your monitor instructions.  Clean area with 4 enclosed alcohol pads. Let dry.  Apply patch as indicated in monitor instructions. Patch will be placed under collarbone on left  side of chest with arrow pointing upward.  Rub patch adhesive wings for 2 minutes. Remove white label marked "1". Remove the white  label marked "2". Rub patch adhesive wings for 2 additional minutes.  While looking in a mirror, press and release button in center of patch. A small green light will  flash 3-4 times. This will be your only indicator that the monitor has been turned on.  Do not shower for the first 24 hours. You may shower after the first 24 hours.  Press the button if you feel a symptom. You will hear a small click. Record Date, Time and  Symptom in the Patient Logbook.  When you are ready to remove the patch, follow instructions on the last 2 pages of Patient  Logbook. Stick patch monitor onto the last page of Patient Logbook.  Place Patient Logbook in the blue and white box. Use locking tab on box and tape box closed  securely. The blue and white box has prepaid postage on it. Please place it in the mailbox as  soon as possible. Your physician should have your test results approximately 7 days after the  monitor has been mailed back to [facility].  Call  [facility] Customer Care at [phone] if you have questions regarding  your ZIO XT patch monitor. Call them immediately if you see an orange light blinking on your  monitor.  If your monitor falls off in less than 4 days, contact our Monitor department at [phone].  If your monitor becomes loose or falls off after 4 days call [employer] at [phone] for  suggestions on securing your monitor    I,Mathew Stumpf,acting as a scribe for Jodelle Red, MD.,have documented all relevant documentation on the behalf of Jodelle Red, MD,as directed by  Jodelle Red, MD while in the presence of Jodelle Red, MD.  I, Jodelle Red, MD, have reviewed all documentation for this visit. The documentation on 10/22/20 for the exam, diagnosis, procedures, and orders are all accurate and complete.   Signed, Jodelle Red, MD PhD 10/02/2020 12:43 PM    [facility]

## 2020-10-13 ENCOUNTER — Ambulatory Visit: Payer: Medicaid Other | Admitting: Cardiology

## 2020-10-22 ENCOUNTER — Encounter (HOSPITAL_BASED_OUTPATIENT_CLINIC_OR_DEPARTMENT_OTHER): Payer: Self-pay | Admitting: Cardiology

## 2020-11-13 ENCOUNTER — Ambulatory Visit (INDEPENDENT_AMBULATORY_CARE_PROVIDER_SITE_OTHER): Payer: Medicaid Other

## 2020-11-13 ENCOUNTER — Other Ambulatory Visit: Payer: Self-pay

## 2020-11-13 DIAGNOSIS — R55 Syncope and collapse: Secondary | ICD-10-CM

## 2020-11-13 LAB — ECHOCARDIOGRAM COMPLETE
AR max vel: 3.29 cm2
AV Area VTI: 3.15 cm2
AV Area mean vel: 2.97 cm2
AV Mean grad: 3 mmHg
AV Peak grad: 6.2 mmHg
Ao pk vel: 1.24 m/s
Area-P 1/2: 4.29 cm2
Calc EF: 55.7 %
S' Lateral: 3.42 cm
Single Plane A2C EF: 58.5 %
Single Plane A4C EF: 54.6 %
